# Patient Record
Sex: Male | Born: 1965 | Race: White | Hispanic: No | Marital: Married | Smoking: Former smoker
Health system: Southern US, Community
[De-identification: ages and names within clinical notes are randomized; demographics above are authoritative.]

## PROBLEM LIST (undated history)

## (undated) DIAGNOSIS — K5790 Diverticulosis of intestine, part unspecified, without perforation or abscess without bleeding: Secondary | ICD-10-CM

## (undated) DIAGNOSIS — K769 Liver disease, unspecified: Secondary | ICD-10-CM

## (undated) DIAGNOSIS — G4730 Sleep apnea, unspecified: Secondary | ICD-10-CM

## (undated) DIAGNOSIS — K754 Autoimmune hepatitis: Secondary | ICD-10-CM

## (undated) DIAGNOSIS — N200 Calculus of kidney: Secondary | ICD-10-CM

## (undated) DIAGNOSIS — I10 Essential (primary) hypertension: Secondary | ICD-10-CM

## (undated) DIAGNOSIS — K219 Gastro-esophageal reflux disease without esophagitis: Secondary | ICD-10-CM

## (undated) HISTORY — DX: Essential (primary) hypertension: I10

## (undated) HISTORY — DX: Sleep apnea, unspecified: G47.30

## (undated) HISTORY — DX: Calculus of kidney: N20.0

## (undated) HISTORY — DX: Liver disease, unspecified: K76.9

## (undated) HISTORY — DX: Gastro-esophageal reflux disease without esophagitis: K21.9

## (undated) HISTORY — DX: Autoimmune hepatitis: K75.4

## (undated) HISTORY — PX: LITHOTRIPSY: SUR834

## (undated) HISTORY — DX: Diverticulosis of intestine, part unspecified, without perforation or abscess without bleeding: K57.90

---

## 2015-08-25 HISTORY — PX: COLONOSCOPY: SHX174

## 2015-08-25 HISTORY — PX: ESOPHAGOGASTRODUODENOSCOPY: SHX1529

## 2015-11-13 LAB — HM COLONOSCOPY

## 2017-08-24 HISTORY — PX: ESOPHAGOGASTRODUODENOSCOPY: SHX1529

## 2018-08-11 DIAGNOSIS — K7460 Unspecified cirrhosis of liver: Secondary | ICD-10-CM | POA: Insufficient documentation

## 2020-02-07 ENCOUNTER — Encounter: Payer: Self-pay | Admitting: Family Medicine

## 2020-02-07 ENCOUNTER — Encounter: Payer: Self-pay | Admitting: Gastroenterology

## 2020-02-07 ENCOUNTER — Other Ambulatory Visit: Payer: Self-pay

## 2020-02-07 ENCOUNTER — Ambulatory Visit (INDEPENDENT_AMBULATORY_CARE_PROVIDER_SITE_OTHER): Payer: BC Managed Care – PPO | Admitting: Family Medicine

## 2020-02-07 VITALS — BP 157/88 | HR 75 | Temp 98.1°F | Ht 65.35 in | Wt 250.4 lb

## 2020-02-07 DIAGNOSIS — I10 Essential (primary) hypertension: Secondary | ICD-10-CM | POA: Diagnosis not present

## 2020-02-07 DIAGNOSIS — K754 Autoimmune hepatitis: Secondary | ICD-10-CM | POA: Diagnosis not present

## 2020-02-07 DIAGNOSIS — R0683 Snoring: Secondary | ICD-10-CM | POA: Diagnosis not present

## 2020-02-07 MED ORDER — ESTRADIOL 1 MG PO TABS: 1.0000 mg | ORAL_TABLET | Freq: Every day | ORAL | 1 refills | Status: DC

## 2020-02-07 NOTE — Assessment & Plan Note (Signed)
Morbid obesity associated with HTN.  Discussed working on weight loss and limiting late night snacking.

## 2020-02-07 NOTE — Assessment & Plan Note (Signed)
Stable with azathioprine.  Reports having labs just before moving to the area. Records requested Referral placed to GI.

## 2020-02-07 NOTE — Assessment & Plan Note (Signed)
Blood pressure is not at goal at for age and co-morbidities.  Reports that readings tend to be higher in doctors office.  I recommend continuation of coreg.  In addition they were instructed to follow a low sodium diet with regular exercise to help to maintain adequate control of blood pressure.

## 2020-02-07 NOTE — Assessment & Plan Note (Signed)
Home sleep study ordered

## 2020-02-07 NOTE — Patient Instructions (Signed)
Great to meet you today! I have entered orders for home sleep study and GI referral.  You will be contacted to set these up.  Please continue current medications for now.

## 2020-02-07 NOTE — Progress Notes (Signed)
Gregory Duarte - 54 y.o. male MRN [id number]  Date of birth: 1966/08/19  Subjective Chief Complaint  Patient presents with  . Establish Care    HPI Gregory Duarte is a 54 y.o. male with history of HTN and autoimmune hepatitis.  Recently moved here from [place] area.  He was followed by GI specialist in [place], reports liver disease has been stable.  He continues on azathioprine for this.  He does need new referral to GI specialist here as well.   For HTN he has been prescribed carvedilol 25mg  BID.  He is compliant with medication.  He denies symptoms related to HTN including chest pain, shortness of breath, palpitations, headache or vision changes.   He does report weight gain of about 20lbs over the past year.  Has had trouble finding motivation for weight loss.  He does try to stay pretty active but admits diet could be better.  He does often snack in the evenings.   He does feel like he sleeps well but has some morning fatigue and wife reports that he snores heavily.   ROS:  A comprehensive ROS was completed and negative except as noted per HPI   No Known Allergies  Past Medical History:  Diagnosis Date  . Autoimmune hepatitis ([place])   . Hypertension     History reviewed. No pertinent surgical history.  Social History   Socioeconomic History  . Marital status: Married    Spouse name: Tyreke Kaeser  . Number of children: Not on file  . Years of education: Not on file  . Highest education level: Not on file  Occupational History  . Not on file  Tobacco Use  . Smoking status: Former Smoker    Types: Cigarettes    Quit date: 1991    Years since quitting: 30.4  . Smokeless tobacco: Never Used  Vaping Use  . Vaping Use: Some days  . Substances: CBD  Substance and Sexual Activity  . Alcohol use: Not Currently  . Drug use: Yes    Types: Marijuana    Comment: sometimes  . Sexual activity: Yes    Partners: Female  Other Topics Concern  . Not on file  Social History  Narrative  . Not on file   Social Determinants of Health   Financial Resource Strain:   . Difficulty of Paying Living Expenses:   Food Insecurity:   . Worried About Charity fundraiser in the Last Year:   . Arboriculturist in the Last Year:   Transportation Needs:   . Film/video editor (Medical):   Marland Kitchen Lack of Transportation (Non-Medical):   Physical Activity:   . Days of Exercise per Week:   . Minutes of Exercise per Session:   Stress:   . Feeling of Stress :   Social Connections:   . Frequency of Communication with Friends and Family:   . Frequency of Social Gatherings with Friends and Family:   . Attends Religious Services:   . Active Member of Clubs or Organizations:   . Attends Archivist Meetings:   Marland Kitchen Marital Status:     Family History  Problem Relation Age of Onset  . Diabetes Mother   . Hypertension Father   . Diabetes Father     Health Maintenance  Topic Date Due  . Hepatitis C Screening  Never done  . HIV Screening  Never done  . TETANUS/TDAP  Never done  . COLONOSCOPY  Never done  . INFLUENZA VACCINE  03/24/2020  . COVID-19 Vaccine  Completed     ----------------------------------------------------------------------------------------------------------------------------------------------------------------------------------------------------------------- Physical Exam BP (!) 157/88 (BP Location: Left Arm, Patient Position: Sitting, Cuff Size: Large)   Pulse 75   Temp 98.1 F (36.7 C) (Oral)   Ht 5' 5.35" (1.66 m)   Wt 250 lb 6.4 oz (113.6 kg)   SpO2 96%   BMI 41.22 kg/m   Physical Exam Constitutional:      Appearance: Normal appearance.  HENT:     Head: Normocephalic and atraumatic.  Eyes:     General: No scleral icterus. Cardiovascular:     Rate and Rhythm: Normal rate and regular rhythm.  Pulmonary:     Effort: Pulmonary effort is normal.     Breath sounds: Normal breath sounds.  Abdominal:     General: Abdomen is flat.  There is no distension.     Palpations: Abdomen is soft.     Tenderness: There is no abdominal tenderness.  Musculoskeletal:     Cervical back: Neck supple.  Skin:    General: Skin is warm and dry.  Neurological:     General: No focal deficit present.     Mental Status: He is alert.  Psychiatric:        Mood and Affect: Mood normal.     ------------------------------------------------------------------------------------------------------------------------------------------------------------------------------------------------------------------- Assessment and Plan  Morbid obesity (HCC) Morbid obesity associated with HTN.  Discussed working on weight loss and limiting late night snacking.   Snoring Home sleep study ordered.   Autoimmune hepatitis (HCC) Stable with azathioprine.  Reports having labs just before moving to the area. Records requested Referral placed to GI.    Essential hypertension Blood pressure is not at goal at for age and co-morbidities.  Reports that readings tend to be higher in doctors office.  I recommend continuation of coreg.  In addition they were instructed to follow a low sodium diet with regular exercise to help to maintain adequate control of blood pressure.     Meds ordered this encounter  Medications  . DISCONTD: estradiol (ESTRACE) 1 MG tablet    Sig: Take 1 tablet (1 mg total) by mouth daily.    Dispense:  90 tablet    Refill:  1    Return in about 3 months (around 05/09/2020) for HTN.    This visit occurred during the SARS-CoV-2 public health emergency.  Safety protocols were in place, including screening questions prior to the visit, additional usage of staff PPE, and extensive cleaning of exam room while observing appropriate contact time as indicated for disinfecting solutions.

## 2020-04-05 ENCOUNTER — Other Ambulatory Visit: Payer: Self-pay

## 2020-04-05 ENCOUNTER — Encounter: Payer: Self-pay | Admitting: Gastroenterology

## 2020-04-05 ENCOUNTER — Ambulatory Visit (INDEPENDENT_AMBULATORY_CARE_PROVIDER_SITE_OTHER): Payer: BC Managed Care – PPO | Admitting: Gastroenterology

## 2020-04-05 VITALS — BP 160/94 | HR 89 | Ht 66.0 in | Wt 247.5 lb

## 2020-04-05 DIAGNOSIS — Z86010 Personal history of colonic polyps: Secondary | ICD-10-CM | POA: Diagnosis not present

## 2020-04-05 DIAGNOSIS — K7460 Unspecified cirrhosis of liver: Secondary | ICD-10-CM

## 2020-04-05 DIAGNOSIS — K754 Autoimmune hepatitis: Secondary | ICD-10-CM

## 2020-04-05 NOTE — Progress Notes (Signed)
Chief Complaint:   Referring Provider:  Everrett Coombe, DO      ASSESSMENT AND PLAN;   #1. AIH/NAFLD liver cirrhosis.  No portal hypertension  #2. H/O Polyps on colonoscopy 2017 @[place] .  Repeat in 5 years  #3. GERD. S/P EGD 2017.   Plan: -CBC, CMP, AFP, PT INR -Check HBsAb titer and HAV total Ab.  If neg, would recommend vaccination for A and B. -Continue same meds including omeprazole -2018 Abdo complete. -Start exercising and reduce weight.  Exercise like walking 30 min/day, at least 3 x week.  He can start walking with his wife. -Can have up to 2 cups of coffee per day. -RTC 12 weeks.  Aim is to reduce 6lb over 12 weeks. -Please obtain previous records from [place].  Care everywhere needs authorization.      HPI:    Gregory Duarte is a 54 y.o. male  With obesity, HTN, OSA (likely)  Dx AIH 97yrs ago, initially treated with prednisone and Imuran.  Has been on imuran eversince.  Has been tried to wean off.  Unfortunately when he went to 25 mg of Imuran (over 6 to 7 years ago), his liver's function tests started increasing.  It was decided to continue him on Imuran 50 mg p.o. once a day.  Both parents had autoimmune hepatitis.  Most recently he has been diagnosed as having liver cirrhosis due to autoimmune hepatitis vs NAFLD vs both.  No nausea, vomiting, heartburn (as long as he takes omeprazole once a day), regurgitation, odynophagia or dysphagia.  No significant diarrhea or constipation. No melena or hematochezia. No unintentional weight loss. No abdominal pain.  No H/O itching, skin lesions, easy bruisability, intake of OTC meds including diet pills, herbal medications, anabolic steroids or Tylenol. There is no H/O blood transfusions, IVDA. No jaundice, dark urine or pale stools. No alcohol abuse.  Here to get established.   Past Medical History:  Diagnosis Date   Autoimmune hepatitis (HCC)    Chronic liver disease    Diverticulosis    GERD (gastroesophageal  reflux disease)    Hypertension    Kidney stone    Sleep apnea     Past Surgical History:  Procedure Laterality Date   COLONOSCOPY  2017   [place] [address] [place]   ESOPHAGOGASTRODUODENOSCOPY  2017   [facility]   LITHOTRIPSY     over 10-15 years ago    Family History  Problem Relation Age of Onset   Diabetes Mother    Autoimmune disease Mother        Autoimmune hepatisits   Hypertension Father    Diabetes Father    Autoimmune disease Father        Autoimmune hepatitis   Colon cancer Neg Hx    Esophageal cancer Neg Hx     Social History   Tobacco Use   Smoking status: Former Smoker    Types: Cigarettes    Quit date: 1991    Years since quitting: 30.6   Smokeless tobacco: Never Used  Blaine Hamper Use: Some days   Substances: CBD  Substance Use Topics   Alcohol use: Not Currently   Drug use: Yes    Types: Marijuana    Comment: sometimes    Current Outpatient Medications  Medication Sig Dispense Refill   azaTHIOprine (IMURAN) 50 MG tablet Take 50 mg by mouth daily.     carvedilol (COREG) 12.5 MG tablet Take 25 mg by mouth 2 (two) times  daily with a meal.      OMEPRAZOLE PO Take 1 tablet by mouth daily.     No current facility-administered medications for this visit.    No Known Allergies  Review of Systems:  Constitutional: Denies fever, chills, diaphoresis, appetite change and fatigue.  HEENT: Denies photophobia, eye pain.  Has allergies. Respiratory: Denies SOB, DOE, cough, chest tightness,  and wheezing.   Cardiovascular: Denies chest pain, palpitations and leg swelling.  Genitourinary: Denies dysuria, urgency, frequency, hematuria, flank pain and difficulty urinating.  Has excessive urination. Musculoskeletal: Denies myalgias, back pain, joint swelling, arthralgias and gait problem.  Skin: No rash.  Neurological: Denies dizziness, seizures, syncope, weakness, light-headedness, numbness and headaches.    Hematological: Denies adenopathy. Easy bruising, personal or family bleeding history  Psychiatric/Behavioral: No anxiety or depression     Physical Exam:    BP (!) 160/94    Pulse 89    Ht 5\' 6"  (1.676 m)    Wt 247 lb 8 oz (112.3 kg)    BMI 39.95 kg/m  Wt Readings from Last 3 Encounters:  04/05/20 247 lb 8 oz (112.3 kg)  02/07/20 250 lb 6.4 oz (113.6 kg)   Constitutional:  Well-developed, in no acute distress. Psychiatric: Normal mood and affect. Behavior is normal. HEENT: Pupils normal.  Conjunctivae are normal. No scleral icterus. Neck supple.  Cardiovascular: Normal rate, regular rhythm. No edema Pulmonary/chest: Effort normal and breath sounds normal. No wheezing, rales or rhonchi. Abdominal: Soft, nondistended. Nontender. Bowel sounds active throughout. There are no masses palpable. No hepatomegaly. Rectal:  defered Neurological: Alert and oriented to person place and time. Skin: Skin is warm and dry. No rashes noted.  Data Reviewed: I have personally reviewed following labs and imaging studies    02/09/20, MD 04/05/2020, 2:21 PM  Cc: 04/07/2020, DO

## 2020-04-05 NOTE — Patient Instructions (Addendum)
If you are age 54 or older, your body mass index should be between 23-30. Your Body mass index is 39.95 kg/m. If this is out of the aforementioned range listed, please consider follow up with your Primary Care Provider.  If you are age 24 or younger, your body mass index should be between 19-25. Your Body mass index is 39.95 kg/m. If this is out of the aformentioned range listed, please consider follow up with your Primary Care Provider.    Your provider has requested that you go to the basement level for lab work  At [address] [place], [place], [place]  before leaving today. Press "B" on the elevator. The lab is located at the first door on the left as you exit the elevator.  You have been scheduled for an abdominal ultrasound at [facility] on 04/11/20 at 10 am. Please arrive 15 minutes prior to your appointment for registration. Make certain not to have anything to eat or drink after midnight the day prior to your appointment. Should you need to reschedule your appointment, please contact radiology at [phone]. This test typically takes about 30 minutes to perform.  Continue same medications.  Start exercising and reduce weight.  Walk 30 minutes a day 3 times weekly. Lose 6 pounds over the next 12 weeks.  Can have up to two cups of coffee per day.  Follow up in three months.  Please call the office for an appointment as the schedule is not available at this time.   Thank you,  Dr. Lynann Bologna

## 2020-04-11 ENCOUNTER — Ambulatory Visit (HOSPITAL_COMMUNITY)
Admission: RE | Admit: 2020-04-11 | Discharge: 2020-04-11 | Disposition: A | Discharge status: Home / Self Care | Payer: BC Managed Care – PPO | Source: Ambulatory Visit | Attending: Gastroenterology | Admitting: Gastroenterology

## 2020-04-11 ENCOUNTER — Other Ambulatory Visit: Payer: Self-pay

## 2020-04-11 DIAGNOSIS — K754 Autoimmune hepatitis: Secondary | ICD-10-CM | POA: Diagnosis not present

## 2020-04-11 DIAGNOSIS — K7460 Unspecified cirrhosis of liver: Secondary | ICD-10-CM | POA: Insufficient documentation

## 2020-04-11 DIAGNOSIS — K760 Fatty (change of) liver, not elsewhere classified: Secondary | ICD-10-CM | POA: Diagnosis not present

## 2020-05-09 ENCOUNTER — Encounter: Payer: Self-pay | Admitting: Family Medicine

## 2020-05-09 ENCOUNTER — Ambulatory Visit (INDEPENDENT_AMBULATORY_CARE_PROVIDER_SITE_OTHER): Payer: BC Managed Care – PPO | Admitting: Family Medicine

## 2020-05-09 ENCOUNTER — Other Ambulatory Visit: Payer: Self-pay

## 2020-05-09 VITALS — BP 163/89 | HR 74 | Ht 66.0 in | Wt 248.0 lb

## 2020-05-09 DIAGNOSIS — N529 Male erectile dysfunction, unspecified: Secondary | ICD-10-CM

## 2020-05-09 DIAGNOSIS — E785 Hyperlipidemia, unspecified: Secondary | ICD-10-CM | POA: Insufficient documentation

## 2020-05-09 DIAGNOSIS — Z23 Encounter for immunization: Secondary | ICD-10-CM

## 2020-05-09 DIAGNOSIS — I10 Essential (primary) hypertension: Secondary | ICD-10-CM | POA: Diagnosis not present

## 2020-05-09 MED ORDER — AMLODIPINE BESYLATE 5 MG PO TABS: 5.0000 mg | ORAL_TABLET | Freq: Every day | ORAL | 3 refills | Status: DC

## 2020-05-09 MED ORDER — SILDENAFIL CITRATE 100 MG PO TABS: 50.0000 mg | ORAL_TABLET | Freq: Every day | ORAL | 11 refills | Status: DC | PRN

## 2020-05-09 NOTE — Progress Notes (Signed)
Gregory Duarte - 54 y.o. male MRN [id number]  Date of birth: 08/14/1966  Subjective Chief Complaint  Patient presents with  . Hypertension    HPI Gregory Duarte is a 54 y.o. male here today for follow up of HTN.  He is currently taking carvedilol however BP remains elevated.  He does not monitor BP at home.  He denies symptoms related to HTN including chest pain, shortness of breath, palpitations, headache or vision change.  He is also requesting renewal of sildenafil as well.  He used this previously with good results.  Denies side effects related to this medication.      ROS:  A comprehensive ROS was completed and negative except as noted per HPI  No Known Allergies  Past Medical History:  Diagnosis Date  . Autoimmune hepatitis (HCC)   . Chronic liver disease   . Diverticulosis   . GERD (gastroesophageal reflux disease)   . Hypertension   . Kidney stone   . Sleep apnea     Past Surgical History:  Procedure Laterality Date  . COLONOSCOPY  2017   [place], [place]. Dr Blaine Hamper  . ESOPHAGOGASTRODUODENOSCOPY  2019   [facility] Blaine Hamper  . LITHOTRIPSY     over 10-15 years ago    Social History   Socioeconomic History  . Marital status: Married    Spouse name: Paulette Lynch  . Number of children: Not on file  . Years of education: Not on file  . Highest education level: Not on file  Occupational History  . Not on file  Tobacco Use  . Smoking status: Former Smoker    Types: Cigarettes    Quit date: 1991    Years since quitting: 30.7  . Smokeless tobacco: Never Used  Vaping Use  . Vaping Use: Some days  . Substances: CBD  Substance and Sexual Activity  . Alcohol use: Not Currently  . Drug use: Yes    Types: Marijuana    Comment: sometimes  . Sexual activity: Yes    Partners: Female  Other Topics Concern  . Not on file  Social History Narrative  . Not on file   Social Determinants of Health   Financial Resource Strain:   . Difficulty of Paying Living  Expenses: Not on file  Food Insecurity:   . Worried About Programme researcher, broadcasting/film/video in the Last Year: Not on file  . Ran Out of Food in the Last Year: Not on file  Transportation Needs:   . Lack of Transportation (Medical): Not on file  . Lack of Transportation (Non-Medical): Not on file  Physical Activity:   . Days of Exercise per Week: Not on file  . Minutes of Exercise per Session: Not on file  Stress:   . Feeling of Stress : Not on file  Social Connections:   . Frequency of Communication with Friends and Family: Not on file  . Frequency of Social Gatherings with Friends and Family: Not on file  . Attends Religious Services: Not on file  . Active Member of Clubs or Organizations: Not on file  . Attends Banker Meetings: Not on file  . Marital Status: Not on file    Family History  Problem Relation Age of Onset  . Diabetes Mother   . Autoimmune disease Mother        Autoimmune hepatisits  . Hypertension Father   . Diabetes Father   . Autoimmune disease Father        Autoimmune hepatitis  .  Colon cancer Neg Hx   . Esophageal cancer Neg Hx     Health Maintenance  Topic Date Due  . Hepatitis C Screening  Never done  . HIV Screening  Never done  . TETANUS/TDAP  Never done  . COLONOSCOPY  Never done  . INFLUENZA VACCINE  Never done  . COVID-19 Vaccine  Completed     ----------------------------------------------------------------------------------------------------------------------------------------------------------------------------------------------------------------- Physical Exam BP (!) 163/89   Pulse 74   Ht 5\' 6"  (1.676 m)   Wt 248 lb (112.5 kg)   SpO2 98%   BMI 40.03 kg/m   Physical Exam Constitutional:      Appearance: Normal appearance.  HENT:     Head: Normocephalic and atraumatic.  Eyes:     General: No scleral icterus. Cardiovascular:     Rate and Rhythm: Normal rate and regular rhythm.  Pulmonary:     Effort: Pulmonary effort is  normal.     Breath sounds: Normal breath sounds.  Neurological:     General: No focal deficit present.     Mental Status: He is alert.  Psychiatric:        Mood and Affect: Mood normal.        Behavior: Behavior normal.     ------------------------------------------------------------------------------------------------------------------------------------------------------------------------------------------------------------------- Assessment and Plan  Essential hypertension Blood pressure is not at goal at for age and co-morbidities.  I recommend addition of amlodipine 5mg  to current rx of carvedilol.  In addition they were instructed to follow a low sodium diet with regular exercise to help to maintain adequate control of blood pressure.    Erectile dysfunction He has had success and tolerated sildenafil well previously.  Will re-prescribe.  Side effects reviewed.    Meds ordered this encounter  Medications  . amLODipine (NORVASC) 5 MG tablet    Sig: Take 1 tablet (5 mg total) by mouth daily.    Dispense:  90 tablet    Refill:  3  . sildenafil (VIAGRA) 100 MG tablet    Sig: Take 0.5-1 tablets (50-100 mg total) by mouth daily as needed for erectile dysfunction.    Dispense:  15 tablet    Refill:  11    Return in about 6 weeks (around 06/20/2020) for BP .    This visit occurred during the SARS-CoV-2 public health emergency.  Safety protocols were in place, including screening questions prior to the visit, additional usage of staff PPE, and extensive cleaning of exam room while observing appropriate contact time as indicated for disinfecting solutions.

## 2020-05-09 NOTE — Assessment & Plan Note (Signed)
He has had success and tolerated sildenafil well previously.  Will re-prescribe.  Side effects reviewed.

## 2020-05-09 NOTE — Assessment & Plan Note (Signed)
Blood pressure is not at goal at for age and co-morbidities.  I recommend addition of amlodipine 5mg  to current rx of carvedilol.  In addition they were instructed to follow a low sodium diet with regular exercise to help to maintain adequate control of blood pressure.

## 2020-05-09 NOTE — Patient Instructions (Signed)
Start amlodipine daily.  Follow a reduced sodium diet.  Follow up in 6-8 weeks for BP recheck.

## 2020-06-20 ENCOUNTER — Encounter: Payer: Self-pay | Admitting: Family Medicine

## 2020-06-20 ENCOUNTER — Ambulatory Visit (INDEPENDENT_AMBULATORY_CARE_PROVIDER_SITE_OTHER): Payer: BC Managed Care – PPO | Admitting: Family Medicine

## 2020-06-20 ENCOUNTER — Other Ambulatory Visit: Payer: Self-pay

## 2020-06-20 DIAGNOSIS — I10 Essential (primary) hypertension: Secondary | ICD-10-CM | POA: Diagnosis not present

## 2020-06-20 DIAGNOSIS — B001 Herpesviral vesicular dermatitis: Secondary | ICD-10-CM

## 2020-06-20 MED ORDER — CARVEDILOL 12.5 MG PO TABS: 25.0000 mg | ORAL_TABLET | Freq: Two times a day (BID) | ORAL | 2 refills | Status: DC

## 2020-06-20 MED ORDER — ACYCLOVIR 5 % EX OINT
TOPICAL_OINTMENT | CUTANEOUS | 1 refills | Status: AC
Start: 2020-06-20 — End: ?

## 2020-06-20 MED ORDER — AMLODIPINE BESYLATE 10 MG PO TABS: 10.0000 mg | ORAL_TABLET | Freq: Every day | ORAL | 2 refills | Status: DC

## 2020-06-20 NOTE — Patient Instructions (Signed)
Increase amlodipine to 10mg .  New prescription has been sent in.  Check BP readings at home.  See me again in about 2 months   DASH Eating Plan DASH stands for "Dietary Approaches to Stop Hypertension." The DASH eating plan is a healthy eating plan that has been shown to reduce high blood pressure (hypertension). It may also reduce your risk for type 2 diabetes, heart disease, and stroke. The DASH eating plan may also help with weight loss. What are tips for following this plan?  General guidelines  Avoid eating more than 2,300 mg (milligrams) of salt (sodium) a day. If you have hypertension, you may need to reduce your sodium intake to 1,500 mg a day.  Limit alcohol intake to no more than 1 drink a day for nonpregnant women and 2 drinks a day for men. One drink equals 12 oz of beer, 5 oz of wine, or 1 oz of hard liquor.  Work with your health care provider to maintain a healthy body weight or to lose weight. Ask what an ideal weight is for you.  Get at least 30 minutes of exercise that causes your heart to beat faster (aerobic exercise) most days of the week. Activities may include walking, swimming, or biking.  Work with your health care provider or diet and nutrition specialist (dietitian) to adjust your eating plan to your individual calorie needs. Reading food labels   Check food labels for the amount of sodium per serving. Choose foods with less than 5 percent of the Daily Value of sodium. Generally, foods with less than 300 mg of sodium per serving fit into this eating plan.  To find whole grains, look for the word "whole" as the first word in the ingredient list. Shopping  Buy products labeled as "low-sodium" or "no salt added."  Buy fresh foods. Avoid canned foods and premade or frozen meals. Cooking  Avoid adding salt when cooking. Use salt-free seasonings or herbs instead of table salt or sea salt. Check with your health care provider or pharmacist before using salt  substitutes.  Do not fry foods. Cook foods using healthy methods such as baking, boiling, grilling, and broiling instead.  Cook with heart-healthy oils, such as olive, canola, soybean, or sunflower oil. Meal planning  Eat a balanced diet that includes: ? 5 or more servings of fruits and vegetables each day. At each meal, try to fill half of your plate with fruits and vegetables. ? Up to 6-8 servings of whole grains each day. ? Less than 6 oz of lean meat, poultry, or fish each day. A 3-oz serving of meat is about the same size as a deck of cards. One egg equals 1 oz. ? 2 servings of low-fat dairy each day. ? A serving of nuts, seeds, or beans 5 times each week. ? Heart-healthy fats. Healthy fats called Omega-3 fatty acids are found in foods such as flaxseeds and coldwater fish, like sardines, salmon, and mackerel.  Limit how much you eat of the following: ? Canned or prepackaged foods. ? Food that is high in trans fat, such as fried foods. ? Food that is high in saturated fat, such as fatty meat. ? Sweets, desserts, sugary drinks, and other foods with added sugar. ? Full-fat dairy products.  Do not salt foods before eating.  Try to eat at least 2 vegetarian meals each week.  Eat more home-cooked food and less restaurant, buffet, and fast food.  When eating at a restaurant, ask that your food be  prepared with less salt or no salt, if possible. What foods are recommended? The items listed may not be a complete list. Talk with your dietitian about what dietary choices are best for you. Grains Whole-grain or whole-wheat bread. Whole-grain or whole-wheat pasta. Bitton rice. Modena Morrow. Bulgur. Whole-grain and low-sodium cereals. Pita bread. Low-fat, low-sodium crackers. Whole-wheat flour tortillas. Vegetables Fresh or frozen vegetables (raw, steamed, roasted, or grilled). Low-sodium or reduced-sodium tomato and vegetable juice. Low-sodium or reduced-sodium tomato sauce and tomato  paste. Low-sodium or reduced-sodium canned vegetables. Fruits All fresh, dried, or frozen fruit. Canned fruit in natural juice (without added sugar). Meat and other protein foods Skinless chicken or [place]. Ground chicken or [place]. Pork with fat trimmed off. Fish and seafood. Egg whites. Dried beans, peas, or lentils. Unsalted nuts, nut butters, and seeds. Unsalted canned beans. Lean cuts of beef with fat trimmed off. Low-sodium, lean deli meat. Dairy Low-fat (1%) or fat-free (skim) milk. Fat-free, low-fat, or reduced-fat cheeses. Nonfat, low-sodium ricotta or cottage cheese. Low-fat or nonfat yogurt. Low-fat, low-sodium cheese. Fats and oils Soft margarine without trans fats. Vegetable oil. Low-fat, reduced-fat, or light mayonnaise and salad dressings (reduced-sodium). Canola, safflower, olive, soybean, and sunflower oils. Avocado. Seasoning and other foods Herbs. Spices. Seasoning mixes without salt. Unsalted popcorn and pretzels. Fat-free sweets. What foods are not recommended? The items listed may not be a complete list. Talk with your dietitian about what dietary choices are best for you. Grains Baked goods made with fat, such as croissants, muffins, or some breads. Dry pasta or rice meal packs. Vegetables Creamed or fried vegetables. Vegetables in a cheese sauce. Regular canned vegetables (not low-sodium or reduced-sodium). Regular canned tomato sauce and paste (not low-sodium or reduced-sodium). Regular tomato and vegetable juice (not low-sodium or reduced-sodium). Angie Fava. Olives. Fruits Canned fruit in a light or heavy syrup. Fried fruit. Fruit in cream or butter sauce. Meat and other protein foods Fatty cuts of meat. Ribs. Fried meat. Berniece Salines. Sausage. Bologna and other processed lunch meats. Salami. Fatback. Hotdogs. Bratwurst. Salted nuts and seeds. Canned beans with added salt. Canned or smoked fish. Whole eggs or egg yolks. Chicken or [place] with skin. Dairy Whole or 2% milk,  cream, and half-and-half. Whole or full-fat cream cheese. Whole-fat or sweetened yogurt. Full-fat cheese. Nondairy creamers. Whipped toppings. Processed cheese and cheese spreads. Fats and oils Butter. Stick margarine. Lard. Shortening. Ghee. Bacon fat. Tropical oils, such as coconut, palm kernel, or palm oil. Seasoning and other foods Salted popcorn and pretzels. Onion salt, garlic salt, seasoned salt, table salt, and sea salt. Worcestershire sauce. Tartar sauce. Barbecue sauce. Teriyaki sauce. Soy sauce, including reduced-sodium. Steak sauce. Canned and packaged gravies. Fish sauce. Oyster sauce. Cocktail sauce. Horseradish that you find on the shelf. Ketchup. Mustard. Meat flavorings and tenderizers. Bouillon cubes. Hot sauce and Tabasco sauce. Premade or packaged marinades. Premade or packaged taco seasonings. Relishes. Regular salad dressings. Where to find more information:  National Heart, Lung, and [place]: https://wilson-eaton.com/  American Heart Association: www.heart.org Summary  The DASH eating plan is a healthy eating plan that has been shown to reduce high blood pressure (hypertension). It may also reduce your risk for type 2 diabetes, heart disease, and stroke.  With the DASH eating plan, you should limit salt (sodium) intake to 2,300 mg a day. If you have hypertension, you may need to reduce your sodium intake to 1,500 mg a day.  When on the DASH eating plan, aim to eat more fresh fruits and vegetables, whole grains,  lean proteins, low-fat dairy, and heart-healthy fats.  Work with your health care provider or diet and nutrition specialist (dietitian) to adjust your eating plan to your individual calorie needs. This information is not intended to replace advice given to you by your health care provider. Make sure you discuss any questions you have with your health care provider. Document Revised: 07/23/2017 Document Reviewed: 08/03/2016 Elsevier Patient Education  2020 [employer].

## 2020-06-20 NOTE — Assessment & Plan Note (Signed)
Using zovirax as needed, working well for him.  Will plan to continue, rx renewed.

## 2020-06-20 NOTE — Assessment & Plan Note (Signed)
BP remains elevated.  Increase amlodpine to 10mg  daily. Continue coreg at current strength.   Recommend home monitoring as well.  Given handout on DASH diet.  Follow up in 2 months.

## 2020-06-20 NOTE — Progress Notes (Signed)
Gregory Duarte - 54 y.o. male MRN [id number]  Date of birth: Jun 01, 1966  Subjective Chief Complaint  Patient presents with  . Hypertension  . Mouth Lesions    HPI Gregory Duarte is a 54 y.o. male here today for follow up of HTN.  He also needs refill of zovirax ointment for cold sores.  He uses this as needed.   BP current treatment with amlodipine 5mg  and carvedilol 12.5mg  daily.  He is tolerating medications well without side effects.  He is starting to work on cutting back on sodium intake.  He does not monitor BP at home.  He has not had symptoms related to HTN including chest pain, shortness of breath, palpitations, headache or vision changes.   ROS:  A comprehensive ROS was completed and negative except as noted per HPI  No Known Allergies  Past Medical History:  Diagnosis Date  . Autoimmune hepatitis (HCC)   . Chronic liver disease   . Diverticulosis   . GERD (gastroesophageal reflux disease)   . Hypertension   . Kidney stone   . Sleep apnea     Past Surgical History:  Procedure Laterality Date  . COLONOSCOPY  2017   [place] [place]  . ESOPHAGOGASTRODUODENOSCOPY  2019   [facility]  . LITHOTRIPSY     over 10-15 years ago    Social History   Socioeconomic History  . Marital status: Married    Spouse name: Tyge Somers  . Number of children: Not on file  . Years of education: Not on file  . Highest education level: Not on file  Occupational History  . Not on file  Tobacco Use  . Smoking status: Former Smoker    Types: Cigarettes    Quit date: 1991    Years since quitting: 30.8  . Smokeless tobacco: Never Used  Vaping Use  . Vaping Use: Some days  . Substances: CBD  Substance and Sexual Activity  . Alcohol use: Not Currently  . Drug use: Yes    Types: Marijuana    Comment: sometimes  . Sexual activity: Yes    Partners: Female  Other Topics Concern  . Not on file  Social History Narrative  . Not on file   Social  Determinants of Health   Financial Resource Strain:   . Difficulty of Paying Living Expenses: Not on file  Food Insecurity:   . Worried About Danie Binder in the Last Year: Not on file  . Ran Out of Food in the Last Year: Not on file  Transportation Needs:   . Lack of Transportation (Medical): Not on file  . Lack of Transportation (Non-Medical): Not on file  Physical Activity:   . Days of Exercise per Week: Not on file  . Minutes of Exercise per Session: Not on file  Stress:   . Feeling of Stress : Not on file  Social Connections:   . Frequency of Communication with Friends and Family: Not on file  . Frequency of Social Gatherings with Friends and Family: Not on file  . Attends Religious Services: Not on file  . Active Member of Clubs or Organizations: Not on file  . Attends Programme researcher, broadcasting/film/video Meetings: Not on file  . Marital Status: Not on file    Family History  Problem Relation Age of Onset  . Diabetes Mother   . Autoimmune disease Mother        Autoimmune hepatisits  . Hypertension Father   . Diabetes  Father   . Autoimmune disease Father        Autoimmune hepatitis  . Colon cancer Neg Hx   . Esophageal cancer Neg Hx     Health Maintenance  Topic Date Due  . Hepatitis C Screening  Never done  . HIV Screening  Never done  . COLONOSCOPY  Never done  . TETANUS/TDAP  05/09/2030  . INFLUENZA VACCINE  Completed  . COVID-19 Vaccine  Completed     ----------------------------------------------------------------------------------------------------------------------------------------------------------------------------------------------------------------- Physical Exam BP (!) 161/83 (BP Location: Left Arm, Patient Position: Sitting, Cuff Size: Large)   Pulse 77   Temp 98 F (36.7 C)   Wt 248 lb 12.8 oz (112.9 kg)   SpO2 98%   BMI 40.16 kg/m   Physical Exam Constitutional:      Appearance: Normal appearance.  Cardiovascular:     Rate and Rhythm: Normal  rate and regular rhythm.  Pulmonary:     Effort: Pulmonary effort is normal.     Breath sounds: Normal breath sounds.  Skin:    General: Skin is warm and dry.  Neurological:     General: No focal deficit present.     Mental Status: He is alert.  Psychiatric:        Mood and Affect: Mood normal.     ------------------------------------------------------------------------------------------------------------------------------------------------------------------------------------------------------------------- Assessment and Plan  Recurrent cold sores Using zovirax as needed, working well for him.  Will plan to continue, rx renewed.   Essential hypertension BP remains elevated.  Increase amlodpine to 10mg  daily. Continue coreg at current strength.   Recommend home monitoring as well.  Given handout on DASH diet.  Follow up in 2 months.    Meds ordered this encounter  Medications  . amLODipine (NORVASC) 10 MG tablet    Sig: Take 1 tablet (10 mg total) by mouth daily.    Dispense:  90 tablet    Refill:  2  . acyclovir ointment (ZOVIRAX) 5 %    Sig: Apply every 3 hours while awake.  Apply up to 4 days as needed.    Dispense:  30 g    Refill:  1  . carvedilol (COREG) 12.5 MG tablet    Sig: Take 2 tablets (25 mg total) by mouth 2 (two) times daily with a meal.    Dispense:  90 tablet    Refill:  2    Return in about 2 months (around 08/20/2020) for HTN.    This visit occurred during the SARS-CoV-2 public health emergency.  Safety protocols were in place, including screening questions prior to the visit, additional usage of staff PPE, and extensive cleaning of exam room while observing appropriate contact time as indicated for disinfecting solutions.

## 2020-07-11 ENCOUNTER — Encounter: Payer: Self-pay | Admitting: Family Medicine

## 2020-08-22 ENCOUNTER — Encounter: Payer: Self-pay | Admitting: Family Medicine

## 2020-09-03 ENCOUNTER — Ambulatory Visit: Payer: BC Managed Care – PPO | Admitting: Family Medicine

## 2020-10-02 ENCOUNTER — Other Ambulatory Visit: Payer: Self-pay | Admitting: Family Medicine

## 2021-03-02 ENCOUNTER — Other Ambulatory Visit: Payer: Self-pay | Admitting: Family Medicine

## 2021-03-02 DIAGNOSIS — I10 Essential (primary) hypertension: Secondary | ICD-10-CM

## 2021-03-05 NOTE — Telephone Encounter (Signed)
Please call pt and schedule appt for refills. Sent 30 days to hold.

## 2021-03-06 NOTE — Telephone Encounter (Signed)
Left voicemail for patient to call back to get this appointment scheduled. AM  

## 2021-03-28 ENCOUNTER — Other Ambulatory Visit: Payer: Self-pay | Admitting: Family Medicine

## 2021-03-28 DIAGNOSIS — I10 Essential (primary) hypertension: Secondary | ICD-10-CM

## 2021-03-31 ENCOUNTER — Other Ambulatory Visit: Payer: Self-pay | Admitting: Gastroenterology

## 2021-03-31 DIAGNOSIS — K7460 Unspecified cirrhosis of liver: Secondary | ICD-10-CM

## 2021-03-31 DIAGNOSIS — K754 Autoimmune hepatitis: Secondary | ICD-10-CM

## 2021-04-01 NOTE — Telephone Encounter (Signed)
Please call and schedule patient for follow-up appt with Dr. Ashley Royalty for hypertension.  Sending 15 day refill.  Thanks

## 2021-04-02 NOTE — Telephone Encounter (Signed)
LVM informing patient of no more refills until he follows up and that 15 day is the last refill

## 2021-04-13 ENCOUNTER — Other Ambulatory Visit: Payer: Self-pay | Admitting: Family Medicine

## 2021-04-13 DIAGNOSIS — I10 Essential (primary) hypertension: Secondary | ICD-10-CM

## 2021-04-19 ENCOUNTER — Other Ambulatory Visit: Payer: Self-pay | Admitting: Family Medicine

## 2021-04-19 DIAGNOSIS — I10 Essential (primary) hypertension: Secondary | ICD-10-CM

## 2021-04-22 ENCOUNTER — Encounter: Payer: Self-pay | Admitting: Family Medicine

## 2021-04-22 DIAGNOSIS — I10 Essential (primary) hypertension: Secondary | ICD-10-CM

## 2021-04-22 MED ORDER — CARVEDILOL 12.5 MG PO TABS
ORAL_TABLET | ORAL | 0 refills | Status: DC
Start: 2021-04-22 — End: 2021-05-06

## 2021-05-06 ENCOUNTER — Ambulatory Visit (INDEPENDENT_AMBULATORY_CARE_PROVIDER_SITE_OTHER): Payer: BC Managed Care – PPO | Admitting: Family Medicine

## 2021-05-06 ENCOUNTER — Encounter: Payer: Self-pay | Admitting: Family Medicine

## 2021-05-06 VITALS — BP 160/82 | HR 70 | Temp 97.9°F | Ht 66.0 in | Wt 239.0 lb

## 2021-05-06 DIAGNOSIS — Z125 Encounter for screening for malignant neoplasm of prostate: Secondary | ICD-10-CM

## 2021-05-06 DIAGNOSIS — K754 Autoimmune hepatitis: Secondary | ICD-10-CM

## 2021-05-06 DIAGNOSIS — I10 Essential (primary) hypertension: Secondary | ICD-10-CM | POA: Diagnosis not present

## 2021-05-06 DIAGNOSIS — Z114 Encounter for screening for human immunodeficiency virus [HIV]: Secondary | ICD-10-CM

## 2021-05-06 DIAGNOSIS — Z23 Encounter for immunization: Secondary | ICD-10-CM

## 2021-05-06 DIAGNOSIS — Z13220 Encounter for screening for lipoid disorders: Secondary | ICD-10-CM

## 2021-05-06 DIAGNOSIS — Z0000 Encounter for general adult medical examination without abnormal findings: Secondary | ICD-10-CM | POA: Diagnosis not present

## 2021-05-06 DIAGNOSIS — Z1159 Encounter for screening for other viral diseases: Secondary | ICD-10-CM | POA: Diagnosis not present

## 2021-05-06 DIAGNOSIS — B353 Tinea pedis: Secondary | ICD-10-CM

## 2021-05-06 MED ORDER — AMLODIPINE BESYLATE 10 MG PO TABS: 10.0000 mg | ORAL_TABLET | Freq: Every day | ORAL | 2 refills | Status: DC

## 2021-05-06 MED ORDER — CICLOPIROX OLAMINE 0.77 % EX CREA: TOPICAL_CREAM | Freq: Two times a day (BID) | CUTANEOUS | 1 refills | Status: DC

## 2021-05-06 MED ORDER — CARVEDILOL 12.5 MG PO TABS: ORAL_TABLET | ORAL | 1 refills | Status: DC

## 2021-05-06 NOTE — Assessment & Plan Note (Signed)
Well adult Orders Placed This Encounter  Procedures  . Flu Vaccine QUAD 6+ mos PF IM (Fluarix Quad PF)  . Varicella-zoster vaccine IM (Shingrix)  . Hepatitis C Antibody  . HIV antibody (with reflex)  . COMPLETE METABOLIC PANEL WITH GFR  . CBC with Differential  . Lipid Panel w/reflex Direct LDL  . PSA  Screening: HIV, hepatitis C and PSA. Immunizations: Shingrix No. 2 and flu vaccine given today. Anticipatory guidance/risk factor reduction: Recommendations per AVS.

## 2021-05-06 NOTE — Assessment & Plan Note (Signed)
Recurrent issue for him.  We will try ciclopirox.  Discussed that if not improving we may need to try oral antifungals

## 2021-05-06 NOTE — Progress Notes (Signed)
Gregory Duarte - 55 y.o. male MRN [id number]  Date of birth: 03/15/1966  Subjective Chief Complaint  Patient presents with   Annual Exam    HPI Gregory Duarte is a 55 year old male here today for annual exam.  He has prior history of hypertension and autoimmune hepatitis.  He has been out of his antihypertensive medications.  Blood pressure is elevated today.  He is having some issues with recurrent foot fungus.  He has not been very good with his diet and exercise.  He does plan to make changes to help with this.  He is a non-smoker.  He denies alcohol use.  He would like to have second shingles vaccine.  He is due for flu vaccine as well.  Review of Systems  Constitutional:  Negative for chills, fever, malaise/fatigue and weight loss.  HENT:  Negative for congestion, ear pain and sore throat.   Eyes:  Negative for blurred vision, double vision and pain.  Respiratory:  Negative for cough and shortness of breath.   Cardiovascular:  Negative for chest pain and palpitations.  Gastrointestinal:  Negative for abdominal pain, blood in stool, constipation, heartburn and nausea.  Genitourinary:  Negative for dysuria and urgency.  Musculoskeletal:  Negative for joint pain and myalgias.  Neurological:  Negative for dizziness and headaches.  Endo/Heme/Allergies:  Does not bruise/bleed easily.  Psychiatric/Behavioral:  Negative for depression. The patient is not nervous/anxious and does not have insomnia.    No Known Allergies  Past Medical History:  Diagnosis Date   Autoimmune hepatitis (HCC)    Chronic liver disease    Diverticulosis    GERD (gastroesophageal reflux disease)    Hypertension    Kidney stone    Sleep apnea     Past Surgical History:  Procedure Laterality Date   COLONOSCOPY  2017   [place], [place]. Dr Gregory Duarte   ESOPHAGOGASTRODUODENOSCOPY  [address] Gregory Duarte   LITHOTRIPSY     over 10-15 years ago    Social History   Socioeconomic History   Marital  status: Married    Spouse name: Gregory Duarte   Number of children: Not on file   Years of education: Not on file   Highest education level: Not on file  Occupational History   Not on file  Tobacco Use   Smoking status: Former    Types: Cigarettes    Quit date: 56    Years since quitting: 31.7   Smokeless tobacco: Never  Vaping Use   Vaping Use: Some days   Substances: CBD  Substance and Sexual Activity   Alcohol use: Not Currently   Drug use: Yes    Types: Marijuana    Comment: sometimes   Sexual activity: Yes    Partners: Female  Other Topics Concern   Not on file  Social History Narrative   Not on file   Social Determinants of Health   Financial Resource Strain: Not on file  Food Insecurity: Not on file  Transportation Needs: Not on file  Physical Activity: Not on file  Stress: Not on file  Social Connections: Not on file    Family History  Problem Relation Age of Onset   Diabetes Mother    Autoimmune disease Mother        Autoimmune hepatisits   Hypertension Father    Diabetes Father    Autoimmune disease Father        Autoimmune hepatitis   Colon cancer Neg Hx    Esophageal cancer Neg Hx  Health Maintenance  Topic Date Due   Hepatitis C Screening  Never done   Zoster Vaccines- Shingrix (2 of 2) 07/01/2021   COLONOSCOPY (Pts 45-59yrs Insurance coverage will need to be confirmed)  11/12/2025   TETANUS/TDAP  05/09/2030   INFLUENZA VACCINE  Completed   COVID-19 Vaccine  Completed   HIV Screening  Completed   Pneumococcal Vaccine 64-44 Years old  Aged Out   HPV VACCINES  Aged Out     ----------------------------------------------------------------------------------------------------------------------------------------------------------------------------------------------------------------- Physical Exam BP (!) 160/82 (BP Location: Left Arm, Patient Position: Sitting, Cuff Size: Normal)   Pulse 70   Temp 97.9 F (36.6 C)   Ht 5\' 6"  (1.676 m)    Wt 239 lb (108.4 kg)   SpO2 97%   BMI 38.58 kg/m   Physical Exam Constitutional:      General: He is not in acute distress. HENT:     Head: Normocephalic and atraumatic.     Right Ear: Tympanic membrane and external ear normal.     Left Ear: Tympanic membrane and external ear normal.  Eyes:     General: No scleral icterus. Neck:     Thyroid: No thyromegaly.  Cardiovascular:     Rate and Rhythm: Normal rate and regular rhythm.     Heart sounds: Normal heart sounds.  Pulmonary:     Effort: Pulmonary effort is normal.     Breath sounds: Normal breath sounds.  Abdominal:     General: Bowel sounds are normal. There is no distension.     Palpations: Abdomen is soft.     Tenderness: There is no abdominal tenderness. There is no guarding.  Musculoskeletal:     Cervical back: Normal range of motion.  Lymphadenopathy:     Cervical: No cervical adenopathy.  Skin:    General: Skin is warm and dry.     Findings: No rash.  Neurological:     Mental Status: He is alert and oriented to person, place, and time.     Cranial Nerves: No cranial nerve deficit.     Motor: No abnormal muscle tone.  Psychiatric:        Mood and Affect: Mood normal.        Behavior: Behavior normal.    ------------------------------------------------------------------------------------------------------------------------------------------------------------------------------------------------------------------- Assessment and Plan  Tinea pedis Recurrent issue for him.  We will try ciclopirox.  Discussed that if not improving we may need to try oral antifungals  Essential hypertension Blood pressure elevated.  He has been out of medications.  We will have him restart these and follow-up in a few weeks for blood pressure recheck.  Low-sodium diet and weight loss encouraged.  Well adult exam Well adult Orders Placed This Encounter  Procedures   Flu Vaccine QUAD 6+ mos PF IM (Fluarix Quad PF)    Varicella-zoster vaccine IM (Shingrix)   Hepatitis C Antibody   HIV antibody (with reflex)   COMPLETE METABOLIC PANEL WITH GFR   CBC with Differential   Lipid Panel w/reflex Direct LDL   PSA  Screening: HIV, hepatitis C and PSA. Immunizations: Shingrix No. 2 and flu vaccine given today. Anticipatory guidance/risk factor reduction: Recommendations per AVS.   Meds ordered this encounter  Medications   amLODipine (NORVASC) 10 MG tablet    Sig: Take 1 tablet (10 mg total) by mouth daily.    Dispense:  90 tablet    Refill:  2   carvedilol (COREG) 12.5 MG tablet    Sig: TAKE 2 TABLETS BY MOUTH TWICE A DAY WITH A MEAL  Dispense:  180 tablet    Refill:  1   ciclopirox (LOPROX) 0.77 % cream    Sig: Apply topically 2 (two) times daily.    Dispense:  30 g    Refill:  1    Return in about 6 months (around 11/03/2021) for HTN.    This visit occurred during the SARS-CoV-2 public health emergency.  Safety protocols were in place, including screening questions prior to the visit, additional usage of staff PPE, and extensive cleaning of exam room while observing appropriate contact time as indicated for disinfecting solutions.

## 2021-05-06 NOTE — Patient Instructions (Signed)
Preventive Care 55-55 Years Old, Male Preventive care refers to lifestyle choices and visits with your health care provider that can promote health and wellness. This includes: A yearly physical exam. This is also called an annual wellness visit. Regular dental and eye exams. Immunizations. Screening for certain conditions. Healthy lifestyle choices, such as: Eating a healthy diet. Getting regular exercise. Not using drugs or products that contain nicotine and tobacco. Limiting alcohol use. What can I expect for my preventive care visit? Physical exam Your health care provider will check your: Height and weight. These may be used to calculate your BMI (body mass index). BMI is a measurement that tells if you are at a healthy weight. Heart rate and blood pressure. Body temperature. Skin for abnormal spots. Counseling Your health care provider may ask you questions about your: Past medical problems. Family's medical history. Alcohol, tobacco, and drug use. Emotional well-being. Home life and relationship well-being. Sexual activity. Diet, exercise, and sleep habits. Work and work environment. Access to firearms. What immunizations do I need? Vaccines are usually given at various ages, according to a schedule. Your health care provider will recommend vaccines for you based on your age, medical history, and lifestyle or other factors, such as travel or where you work. What tests do I need? Blood tests Lipid and cholesterol levels. These may be checked every 5 years, or more often if you are over 55 years old. Hepatitis C test. Hepatitis B test. Screening Lung cancer screening. You may have this screening every year starting at age 55 if you have a 30-pack-year history of smoking and currently smoke or have quit within the past 15 years. Prostate cancer screening. Recommendations will vary depending on your family history and other risks. Genital exam to check for testicular cancer  or hernias. Colorectal cancer screening. All adults should have this screening starting at age 55 and continuing until age 75. Your health care provider may recommend screening at age 55 if you are at increased risk. You will have tests every 1-10 years, depending on your results and the type of screening test. Diabetes screening. This is done by checking your blood sugar (glucose) after you have not eaten for a while (fasting). You may have this done every 1-3 years. STD (sexually transmitted disease) testing, if you are at risk. Follow these instructions at home: Eating and drinking  Eat a diet that includes fresh fruits and vegetables, whole grains, lean protein, and low-fat dairy products. Take vitamin and mineral supplements as recommended by your health care provider. Do not drink alcohol if your health care provider tells you not to drink. If you drink alcohol: Limit how much you have to 0-2 drinks a day. Be aware of how much alcohol is in your drink. In the [place] one drink equals one 12 oz bottle of beer (355 mL), one 5 oz glass of wine (148 mL), or one 1 oz glass of hard liquor (44 mL). Lifestyle Take daily care of your teeth and gums. Brush your teeth every morning and night with fluoride toothpaste. Floss one time each day. Stay active. Exercise for at least 30 minutes 5 or more days each week. Do not use any products that contain nicotine or tobacco, such as cigarettes, e-cigarettes, and chewing tobacco. If you need help quitting, ask your health care provider. Do not use drugs. If you are sexually active, practice safe sex. Use a condom or other form of protection to prevent STIs (sexually transmitted infections). If told by your   health care provider, take low-dose aspirin daily starting at age 55. Find healthy ways to cope with stress, such as: Meditation, yoga, or listening to music. Journaling. Talking to a trusted person. Spending time with friends and  family. Safety Always wear your seat belt while driving or riding in a vehicle. Do not drive: If you have been drinking alcohol. Do not ride with someone who has been drinking. When you are tired or distracted. While texting. Wear a helmet and other protective equipment during sports activities. If you have firearms in your house, make sure you follow all gun safety procedures. What's next? Go to your health care provider once a year for an annual wellness visit. Ask your health care provider how often you should have your eyes and teeth checked. Stay up to date on all vaccines. This information is not intended to replace advice given to you by your health care provider. Make sure you discuss any questions you have with your health care provider. Document Revised: 10/18/2020 Document Reviewed: 08/04/2018 Elsevier Patient Education  2022 Elsevier Inc.   

## 2021-05-06 NOTE — Assessment & Plan Note (Signed)
Blood pressure elevated.  He has been out of medications.  We will have him restart these and follow-up in a few weeks for blood pressure recheck.  Low-sodium diet and weight loss encouraged.

## 2021-05-07 LAB — LIPID PANEL W/REFLEX DIRECT LDL
Cholesterol: 207 mg/dL — ABNORMAL HIGH (ref <200)
HDL: 39 mg/dL — ABNORMAL LOW (ref >=40)
LDL Cholesterol (Calc): 128 mg/dL (calc) — ABNORMAL HIGH
Non-HDL Cholesterol (Calc): 168 mg/dL (calc) — ABNORMAL HIGH (ref <130)
Total CHOL/HDL Ratio: 5.3 (calc) — ABNORMAL HIGH (ref <5.0)
Triglycerides: 258 mg/dL — ABNORMAL HIGH (ref <150)

## 2021-05-07 LAB — HEPATITIS C ANTIBODY
Hepatitis C Ab: NONREACTIVE
SIGNAL TO CUT-OFF: 0.04 (ref <1.00)

## 2021-05-07 LAB — CBC WITH DIFFERENTIAL/PLATELET
Absolute Monocytes: 1043 cells/uL — ABNORMAL HIGH (ref 200–950)
Basophils Absolute: 30 cells/uL (ref 0–200)
Basophils Relative: 0.4 %
Eosinophils Absolute: 333 cells/uL (ref 15–500)
Eosinophils Relative: 4.5 %
HCT: 46.7 % (ref 38.5–50.0)
Hemoglobin: 15.7 g/dL (ref 13.2–17.1)
Lymphs Abs: 2657 cells/uL (ref 850–3900)
MCH: 32.9 pg (ref 27.0–33.0)
MCHC: 33.6 g/dL (ref 32.0–36.0)
MCV: 97.9 fL (ref 80.0–100.0)
MPV: 12.2 fL (ref 7.5–12.5)
Monocytes Relative: 14.1 %
Neutro Abs: 3337 cells/uL (ref 1500–7800)
Neutrophils Relative %: 45.1 %
Platelets: 172 10*3/uL (ref 140–400)
RBC: 4.77 10*6/uL (ref 4.20–5.80)
RDW: 13.9 % (ref 11.0–15.0)
Total Lymphocyte: 35.9 %
WBC: 7.4 10*3/uL (ref 3.8–10.8)

## 2021-05-07 LAB — COMPLETE METABOLIC PANEL WITH GFR
AG Ratio: 1.4 (calc) (ref 1.0–2.5)
ALT: 29 U/L (ref 9–46)
AST: 29 U/L (ref 10–35)
Albumin: 4.5 g/dL (ref 3.6–5.1)
Alkaline phosphatase (APISO): 83 U/L (ref 35–144)
BUN: 16 mg/dL (ref 7–25)
CO2: 25 mmol/L (ref 20–32)
Calcium: 9.3 mg/dL (ref 8.6–10.3)
Chloride: 104 mmol/L (ref 98–110)
Creat: 0.88 mg/dL (ref 0.70–1.30)
Globulin: 3.3 g/dL (calc) (ref 1.9–3.7)
Glucose, Bld: 102 mg/dL — ABNORMAL HIGH (ref 65–99)
Potassium: 3.8 mmol/L (ref 3.5–5.3)
Sodium: 137 mmol/L (ref 135–146)
Total Bilirubin: 0.6 mg/dL (ref 0.2–1.2)
Total Protein: 7.8 g/dL (ref 6.1–8.1)
eGFR: 102 mL/min/{1.73_m2} (ref >=60)

## 2021-05-07 LAB — HIV ANTIBODY (ROUTINE TESTING W REFLEX): HIV 1&2 Ab, 4th Generation: NONREACTIVE

## 2021-05-07 LAB — PSA: PSA: 0.23 ng/mL (ref <=4.00)

## 2021-05-09 ENCOUNTER — Telehealth: Payer: Self-pay

## 2021-05-09 NOTE — Telephone Encounter (Signed)
Attempted to contact patient to advise that employment [employer] form was complete and faxed.   No answer. No vm.  Doc sent to scan.

## 2021-05-14 ENCOUNTER — Other Ambulatory Visit: Payer: Self-pay | Admitting: Family Medicine

## 2021-05-14 DIAGNOSIS — I10 Essential (primary) hypertension: Secondary | ICD-10-CM

## 2021-05-15 ENCOUNTER — Other Ambulatory Visit: Payer: Self-pay | Admitting: Family Medicine

## 2021-06-12 ENCOUNTER — Other Ambulatory Visit: Payer: Self-pay | Admitting: Family Medicine

## 2021-07-26 IMAGING — US US ABDOMEN COMPLETE
1 series · 13 of 25 positions shown · non-contrast
Comparison: None.

CLINICAL DATA: 53-year-old male with artery median hepatitis and
fatty liver and cirrhosis.

EXAM:
ABDOMEN ULTRASOUND COMPLETE

[Series 1: us abdomen complete · 13 of 77 slices shown]
[im 1/77]
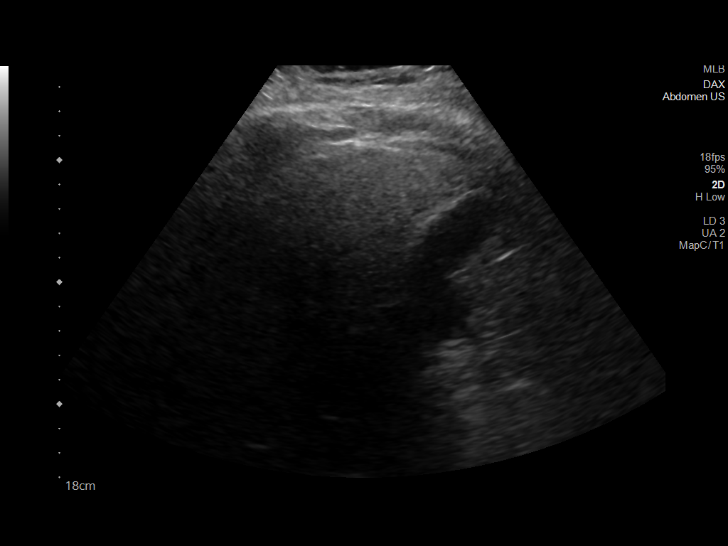
[im 7/77]
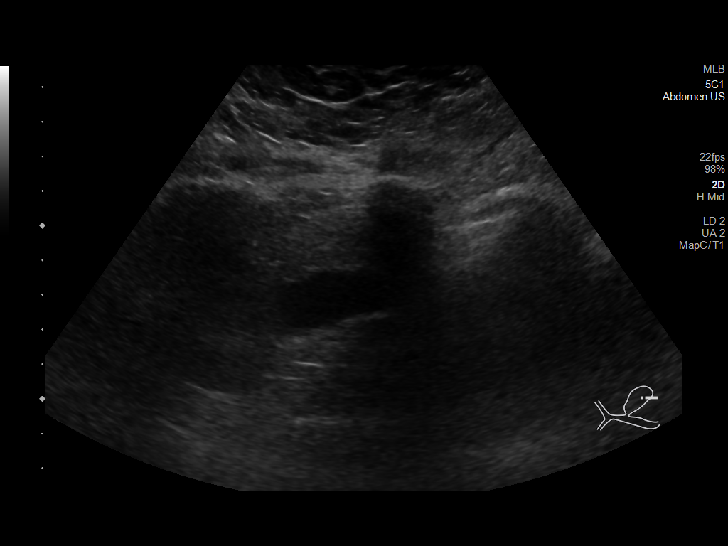
[im 13/77]
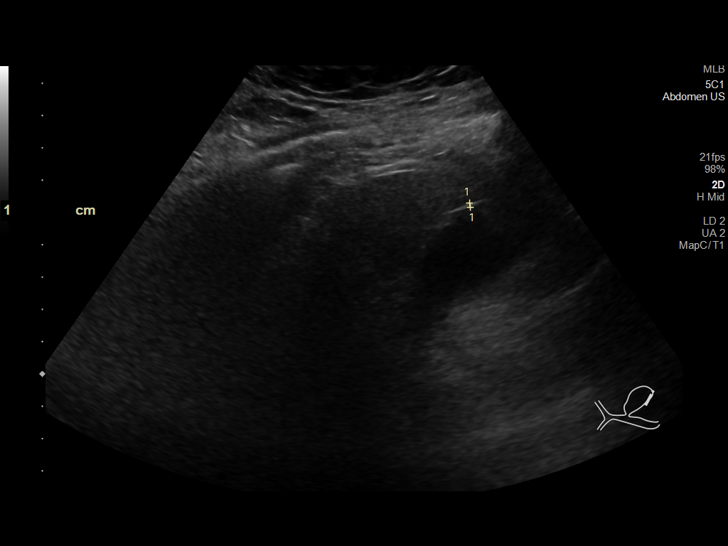
[im 20/77]
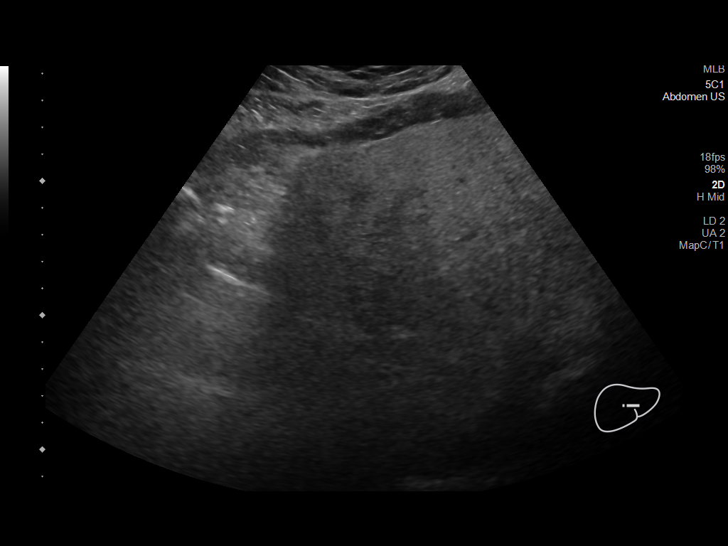
[im 26/77]
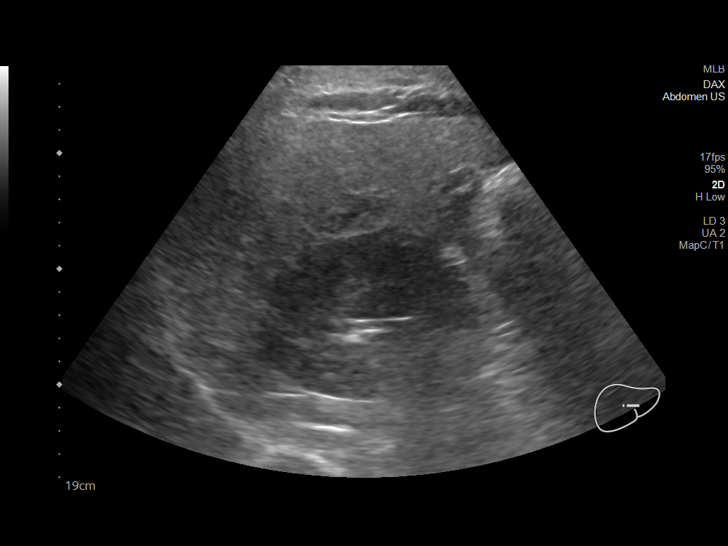
[im 32/77]
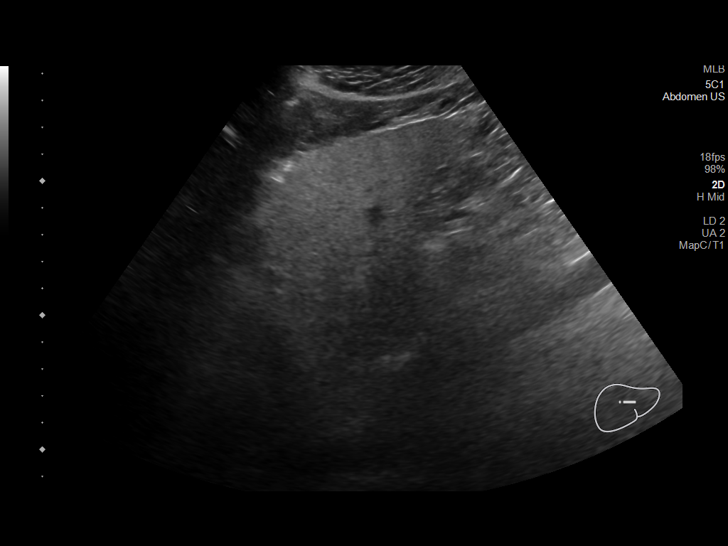
[im 39/77]
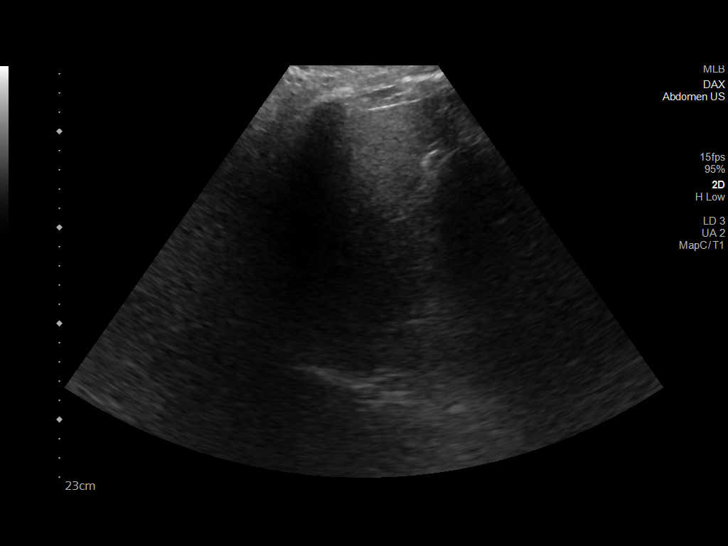
[im 45/77]
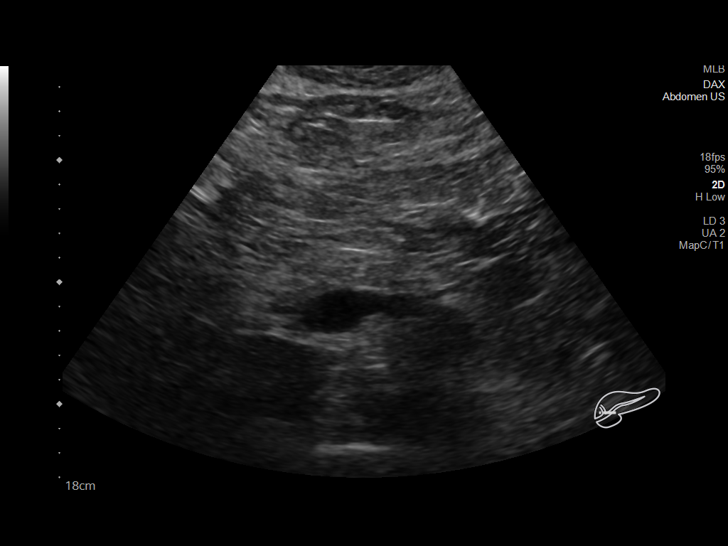
[im 51/77]
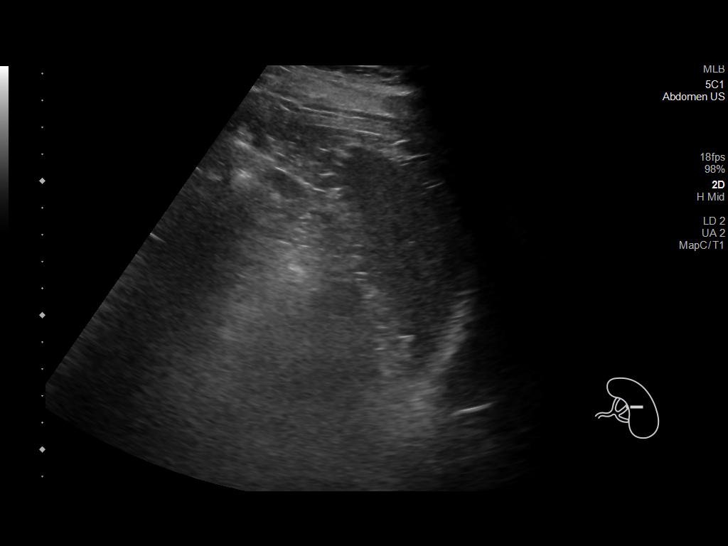
[im 58/77]
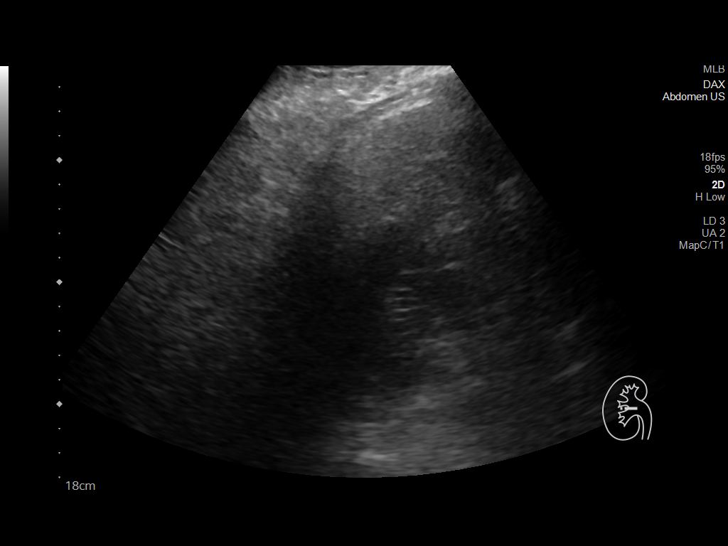
[im 64/77]
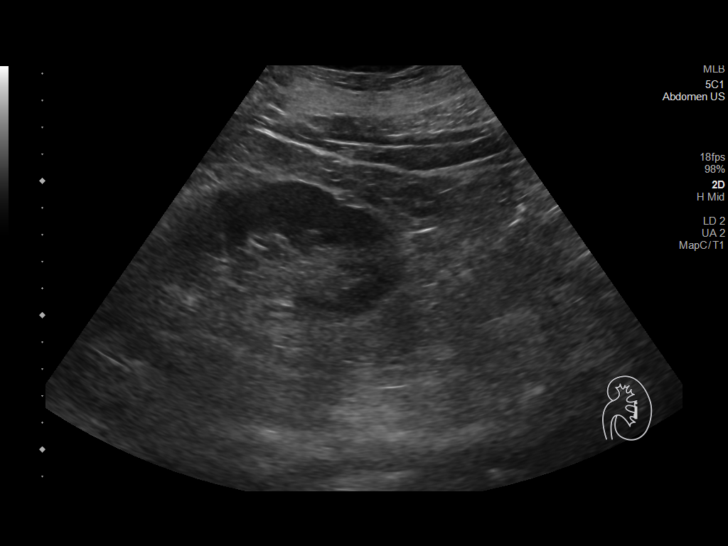
[im 70/77]
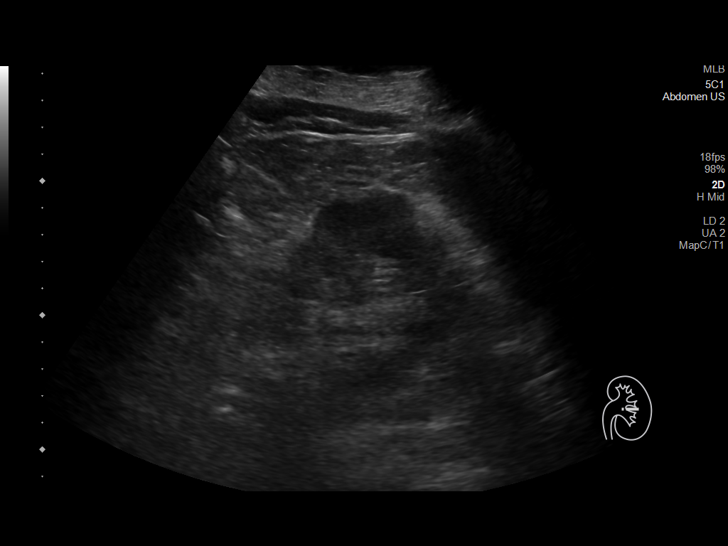
[im 77/77]
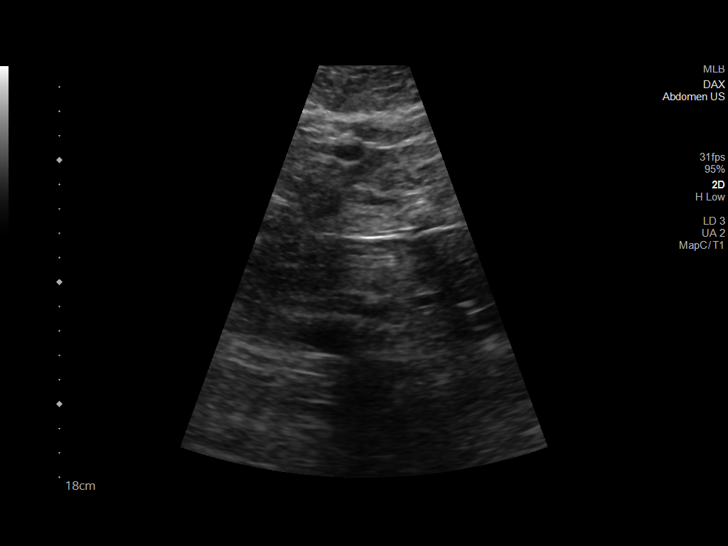

[13 of 25 positions shown; findings below may reference images not displayed]

FINDINGS: Evaluation is limited due to body habitus.

Gallbladder: No gallstones or wall thickening visualized. No
sonographic Murphy sign noted by sonographer.

Common bile duct: Diameter: 6 mm

Liver: There is diffuse increased liver echogenicity. There is
coarsened liver echotexture with mild surface irregularity in
keeping with cirrhosis. Portal vein is patent on color Doppler
imaging with normal direction of blood flow towards the liver.

IVC: No abnormality visualized.

Pancreas: The pancreas is not well visualized and obscured by bowel
gas.

Spleen: Size and appearance within normal limits.

Right Kidney: Length: 11 cm. Echogenicity within normal limits. No
mass or hydronephrosis visualized.

Left Kidney: Length: 12 cm. Echogenicity within normal limits. No
mass or hydronephrosis visualized.

Abdominal aorta: Ectatic aorta measuring up to 2.9 cm in diameter.

Other findings: None.
IMPRESSION: 1. Cirrhosis.
2. Patent main portal vein with hepatopetal flow.
3. No ascites.
4. A 2.9 cm abdominal aortic ectasia.

## 2021-07-27 ENCOUNTER — Other Ambulatory Visit: Payer: Self-pay | Admitting: Family Medicine

## 2021-07-27 DIAGNOSIS — I10 Essential (primary) hypertension: Secondary | ICD-10-CM

## 2021-08-12 ENCOUNTER — Other Ambulatory Visit: Payer: Self-pay | Admitting: Family Medicine

## 2021-10-22 ENCOUNTER — Other Ambulatory Visit: Payer: Self-pay | Admitting: Family Medicine

## 2021-10-22 DIAGNOSIS — I10 Essential (primary) hypertension: Secondary | ICD-10-CM

## 2021-10-24 ENCOUNTER — Other Ambulatory Visit: Payer: Self-pay | Admitting: Family Medicine

## 2021-11-03 ENCOUNTER — Other Ambulatory Visit: Payer: Self-pay

## 2021-11-03 ENCOUNTER — Encounter: Payer: Self-pay | Admitting: Family Medicine

## 2021-11-03 ENCOUNTER — Ambulatory Visit (INDEPENDENT_AMBULATORY_CARE_PROVIDER_SITE_OTHER): Payer: BC Managed Care – PPO | Admitting: Family Medicine

## 2021-11-03 VITALS — BP 161/77 | HR 76 | Ht 66.0 in | Wt 240.0 lb

## 2021-11-03 DIAGNOSIS — K754 Autoimmune hepatitis: Secondary | ICD-10-CM | POA: Diagnosis not present

## 2021-11-03 DIAGNOSIS — R350 Frequency of micturition: Secondary | ICD-10-CM

## 2021-11-03 DIAGNOSIS — I10 Essential (primary) hypertension: Secondary | ICD-10-CM

## 2021-11-03 MED ORDER — VALSARTAN-HYDROCHLOROTHIAZIDE 80-12.5 MG PO TABS
1.0000 | ORAL_TABLET | Freq: Every day | ORAL | 3 refills | Status: DC
Start: 2021-11-03 — End: 2022-07-29

## 2021-11-03 MED ORDER — CARVEDILOL 25 MG PO TABS: ORAL_TABLET | ORAL | 1 refills | Status: DC

## 2021-11-03 NOTE — Assessment & Plan Note (Signed)
Management per GI.  Stable with azathioprine. ?

## 2021-11-03 NOTE — Patient Instructions (Addendum)
Amlodipine may be causing increased swelling.  Discontinue and start Valsartan/HCTZ ?Continue carvedilol at current strength.   ?Return in 2 weeks to have BP check and labs.   ?

## 2021-11-03 NOTE — Assessment & Plan Note (Signed)
Orders Placed This Encounter  ?Procedures  ?? HgB A1c  ?? PSA  ?? BASIC METABOLIC PANEL WITH GFR  ? ? ?

## 2021-11-03 NOTE — Assessment & Plan Note (Signed)
He is having increased swelling with amlodipine.  Switching to valsartan/hydrochlorothiazide.  Continue carvedilol at current strength.  Low-sodium diet encouraged. ?

## 2021-11-03 NOTE — Progress Notes (Signed)
?Gregory Duarte - 56 y.o. male MRN [id number]  Date of birth: 10-Jan-1966 ? ?Subjective ?Chief Complaint  ?Patient presents with  ? Hypertension  ? Leg Swelling  ? ? ?HPI ?Gregory Duarte is a 56 year old male here today for follow-up visit.  Reports he is doing well at this time. ? ?He continues to see GI and hepatology due to history of autoimmune hepatitis with cirrhosis.  Stable at this time. ? ?Blood pressure remains elevated today.  He is taking amlodipine daily.  He has noted increased swelling since starting this.  He denies chest pain, shortness of breath, palpitations, headaches or vision changes. ? ? ?He has had urinary frequency which is worse at night.  Denies pain with urination.  No trouble starting stream. ? ?ROS:  A comprehensive ROS was completed and negative except as noted per HPI ? ? ?No Known Allergies ? ?Past Medical History:  ?Diagnosis Date  ? Autoimmune hepatitis ([place])   ? Chronic liver disease   ? Diverticulosis   ? GERD (gastroesophageal reflux disease)   ? Hypertension   ? Kidney stone   ? Sleep apnea   ? ? ?Past Surgical History:  ?Procedure Laterality Date  ? COLONOSCOPY  2017  ? [place], [place]. Dr Karren Cobble  ? ESOPHAGOGASTRODUODENOSCOPY  2019  ? [facility] Dr Karren Cobble  ? LITHOTRIPSY    ? over 10-15 years ago  ? ? ?Social History  ? ?Socioeconomic History  ? Marital status: Married  ?  Spouse name: Vong Barger  ? Number of children: Not on file  ? Years of education: Not on file  ? Highest education level: Not on file  ?Occupational History  ? Not on file  ?Tobacco Use  ? Smoking status: Former  ?  Types: Cigarettes  ?  Quit date: 37  ?  Years since quitting: 32.2  ? Smokeless tobacco: Never  ?Vaping Use  ? Vaping Use: Some days  ? Substances: CBD  ?Substance and Sexual Activity  ? Alcohol use: Not Currently  ? Drug use: Yes  ?  Types: Marijuana  ?  Comment: sometimes  ? Sexual activity: Yes  ?  Partners: Female  ?Other Topics Concern  ? Not on file  ?Social History Narrative  ? Not on file   ? ?Social Determinants of Health  ? ?Financial Resource Strain: Not on file  ?Food Insecurity: Not on file  ?Transportation Needs: Not on file  ?Physical Activity: Not on file  ?Stress: Not on file  ?Social Connections: Not on file  ? ? ?Family History  ?Problem Relation Age of Onset  ? Diabetes Mother   ? Autoimmune disease Mother   ?     Autoimmune hepatisits  ? Hypertension Father   ? Diabetes Father   ? Autoimmune disease Father   ?     Autoimmune hepatitis  ? Colon cancer Neg Hx   ? Esophageal cancer Neg Hx   ? ? ?Health Maintenance  ?Topic Date Due  ? COLONOSCOPY (Pts 45-29yrs Insurance coverage will need to be confirmed)  11/12/2025  ? TETANUS/TDAP  05/09/2030  ? INFLUENZA VACCINE  Completed  ? COVID-19 Vaccine  Completed  ? Hepatitis C Screening  Completed  ? HIV Screening  Completed  ? Zoster Vaccines- Shingrix  Completed  ? HPV VACCINES  Aged Out  ? ? ? ?----------------------------------------------------------------------------------------------------------------------------------------------------------------------------------------------------------------- ?Physical Exam ?BP (!) 161/77 (BP Location: Left Arm, Patient Position: Sitting, Cuff Size: Normal)   Pulse 76   Ht 5\' 6"  (1.676 m)   Wt 240  lb (108.9 kg)   SpO2 96%   BMI 38.74 kg/m?  ? ?Physical Exam ?Constitutional:   ?   Appearance: Normal appearance.  ?Eyes:  ?   General: No scleral icterus. ?Cardiovascular:  ?   Rate and Rhythm: Normal rate and regular rhythm.  ?Pulmonary:  ?   Effort: Pulmonary effort is normal.  ?   Breath sounds: Normal breath sounds.  ?Musculoskeletal:  ?   Cervical back: Neck supple.  ?Neurological:  ?   General: No focal deficit present.  ?   Mental Status: He is alert.  ?Psychiatric:     ?   Mood and Affect: Mood normal.     ?   Behavior: Behavior normal.   ? ? ?------------------------------------------------------------------------------------------------------------------------------------------------------------------------------------------------------------------- ?Assessment and Plan ? ?Essential hypertension ?He is having increased swelling with amlodipine.  Switching to valsartan/hydrochlorothiazide.  Continue carvedilol at current strength.  Low-sodium diet encouraged. ? ?Autoimmune hepatitis ([place]) ?Management per GI.  Stable with azathioprine. ? ?Urinary frequency ?Orders Placed This Encounter  ?Procedures  ? HgB A1c  ? PSA  ? BASIC METABOLIC PANEL WITH GFR  ? ? ? ?Meds ordered this encounter  ?Medications  ? carvedilol (COREG) 25 MG tablet  ?  Sig: TAKE 1 TABLETS BY MOUTH TWICE A DAY WITH MEALS.  ?  Dispense:  180 tablet  ?  Refill:  1  ? valsartan-hydrochlorothiazide (DIOVAN-HCT) 80-12.5 MG tablet  ?  Sig: Take 1 tablet by mouth daily.  ?  Dispense:  90 tablet  ?  Refill:  3  ? ? ?Return in about 6 months (around 05/06/2022) for HTN. ? ? ? ?This visit occurred during the SARS-CoV-2 public health emergency.  Safety protocols were in place, including screening questions prior to the visit, additional usage of staff PPE, and extensive cleaning of exam room while observing appropriate contact time as indicated for disinfecting solutions.  ? ?

## 2021-11-16 ENCOUNTER — Other Ambulatory Visit: Payer: Self-pay | Admitting: Family Medicine

## 2021-11-16 DIAGNOSIS — I10 Essential (primary) hypertension: Secondary | ICD-10-CM

## 2021-11-17 ENCOUNTER — Other Ambulatory Visit: Payer: Self-pay

## 2021-11-17 ENCOUNTER — Ambulatory Visit (INDEPENDENT_AMBULATORY_CARE_PROVIDER_SITE_OTHER): Payer: BC Managed Care – PPO | Admitting: Family Medicine

## 2021-11-17 VITALS — BP 130/73 | HR 69

## 2021-11-17 DIAGNOSIS — R350 Frequency of micturition: Secondary | ICD-10-CM | POA: Diagnosis not present

## 2021-11-17 DIAGNOSIS — I10 Essential (primary) hypertension: Secondary | ICD-10-CM | POA: Diagnosis not present

## 2021-11-17 NOTE — Progress Notes (Signed)
Medical screening examination/treatment was performed by qualified clinical staff member and as supervising physician I was immediately available for consultation/collaboration. I have reviewed documentation and agree with assessment and plan.  Aalyiah Camberos, DO  

## 2021-11-17 NOTE — Progress Notes (Signed)
? ?Established Patient Office Visit ? ?Subjective:  ?Patient ID: Gregory Duarte, male    DOB: 01-27-1966  Age: 56 y.o. MRN: [id number] ? ?CC:  ?Chief Complaint  ?Patient presents with  ? Hypertension  ? ? ?HPI ?[name] presents for hypertension. Denies chest pain shortness of breath or dizziness.  ? ?Past Medical History:  ?Diagnosis Date  ? Autoimmune hepatitis ([place])   ? Chronic liver disease   ? Diverticulosis   ? GERD (gastroesophageal reflux disease)   ? Hypertension   ? Kidney stone   ? Sleep apnea   ? ? ?Past Surgical History:  ?Procedure Laterality Date  ? COLONOSCOPY  2017  ? [place], [place]. Dr Karren Cobble  ? ESOPHAGOGASTRODUODENOSCOPY  2019  ? [facility] Dr Karren Cobble  ? LITHOTRIPSY    ? over 10-15 years ago  ? ? ?Family History  ?Problem Relation Age of Onset  ? Diabetes Mother   ? Autoimmune disease Mother   ?     Autoimmune hepatisits  ? Hypertension Father   ? Diabetes Father   ? Autoimmune disease Father   ?     Autoimmune hepatitis  ? Colon cancer Neg Hx   ? Esophageal cancer Neg Hx   ? ? ?Social History  ? ?Socioeconomic History  ? Marital status: Married  ?  Spouse name: Blase Beckner  ? Number of children: Not on file  ? Years of education: Not on file  ? Highest education level: Not on file  ?Occupational History  ? Not on file  ?Tobacco Use  ? Smoking status: Former  ?  Types: Cigarettes  ?  Quit date: 38  ?  Years since quitting: 32.2  ? Smokeless tobacco: Never  ?Vaping Use  ? Vaping Use: Some days  ? Substances: CBD  ?Substance and Sexual Activity  ? Alcohol use: Not Currently  ? Drug use: Yes  ?  Types: Marijuana  ?  Comment: sometimes  ? Sexual activity: Yes  ?  Partners: Female  ?Other Topics Concern  ? Not on file  ?Social History Narrative  ? Not on file  ? ?Social Determinants of Health  ? ?Financial Resource Strain: Not on file  ?Food Insecurity: Not on file  ?Transportation Needs: Not on file  ?Physical Activity: Not on file  ?Stress: Not on file  ?Social Connections: Not on file   ?Intimate Partner Violence: Not on file  ? ? ?Outpatient Medications Prior to Visit  ?Medication Sig Dispense Refill  ? acyclovir ointment (ZOVIRAX) 5 % Apply every 3 hours while awake.  Apply up to 4 days as needed. 30 g 1  ? azaTHIOprine (IMURAN) 50 MG tablet Take 50 mg by mouth daily.    ? carvedilol (COREG) 25 MG tablet TAKE 1 TABLETS BY MOUTH TWICE A DAY WITH MEALS. 180 tablet 1  ? ciclopirox (LOPROX) 0.77 % cream APPLY TOPICALLY TWICE A DAY 30 g 1  ? OMEPRAZOLE PO Take 1 tablet by mouth daily.    ? sildenafil (VIAGRA) 100 MG tablet TAKE 1/2 TO 1 TABLET BY MOUTH DAILY AS NEEDED FOR ERECTILE DYSFUNCTION 15 tablet 11  ? valsartan-hydrochlorothiazide (DIOVAN-HCT) 80-12.5 MG tablet Take 1 tablet by mouth daily. 90 tablet 3  ? ?No facility-administered medications prior to visit.  ? ? ?No Known Allergies ? ?ROS ?Review of Systems ? ?  ?Objective:  ?  ?Physical Exam ? ?BP 130/73   Pulse 69   SpO2 98%  ?Wt Readings from Last 3 Encounters:  ?11/03/21 240 lb (108.9  kg)  ?05/06/21 239 lb (108.4 kg)  ?06/20/20 248 lb 12.8 oz (112.9 kg)  ? ? ? ?There are no preventive care reminders to display for this patient. ? ?There are no preventive care reminders to display for this patient. ? ?No results found for: TSH ?Lab Results  ?Component Value Date  ? WBC 7.4 05/06/2021  ? HGB 15.7 05/06/2021  ? HCT 46.7 05/06/2021  ? MCV 97.9 05/06/2021  ? PLT 172 05/06/2021  ? ?Lab Results  ?Component Value Date  ? NA 137 05/06/2021  ? K 3.8 05/06/2021  ? CO2 25 05/06/2021  ? GLUCOSE 102 (H) 05/06/2021  ? BUN 16 05/06/2021  ? CREATININE 0.88 05/06/2021  ? BILITOT 0.6 05/06/2021  ? AST 29 05/06/2021  ? ALT 29 05/06/2021  ? PROT 7.8 05/06/2021  ? CALCIUM 9.3 05/06/2021  ? EGFR 102 05/06/2021  ? ?Lab Results  ?Component Value Date  ? CHOL 207 (H) 05/06/2021  ? ?Lab Results  ?Component Value Date  ? HDL 39 (L) 05/06/2021  ? ?Lab Results  ?Component Value Date  ? LDLCALC 128 (H) 05/06/2021  ? ?Lab Results  ?Component Value Date  ? TRIG 258 (H)  05/06/2021  ? ?Lab Results  ?Component Value Date  ? CHOLHDL 5.3 (H) 05/06/2021  ? ?No results found for: HGBA1C ? ?  ?Assessment & Plan:  ?Hypertension - Blood pressure within normal limits. Patient advised to continue current medications and follow up in 3 months with Dr Zigmund Daniel.  ? ?Problem List Items Addressed This Visit   ? ? Essential hypertension - Primary  ? ? ?No orders of the defined types were placed in this encounter. ? ? ?Follow-up: Return in about 3 months (around 02/17/2022) for HTN with Dr Zigmund Daniel. .  ? ? ?Gregory Duarte, [place] ?

## 2021-11-18 LAB — BASIC METABOLIC PANEL WITH GFR
BUN: 12 mg/dL (ref 7–25)
CO2: 24 mmol/L (ref 20–32)
Calcium: 9 mg/dL (ref 8.6–10.3)
Chloride: 105 mmol/L (ref 98–110)
Creat: 0.96 mg/dL (ref 0.70–1.30)
Glucose, Bld: 123 mg/dL — ABNORMAL HIGH (ref 65–99)
Potassium: 4 mmol/L (ref 3.5–5.3)
Sodium: 138 mmol/L (ref 135–146)
eGFR: 93 mL/min/{1.73_m2} (ref >=60)

## 2021-11-18 LAB — HEMOGLOBIN A1C
Hgb A1c MFr Bld: 6.2 % of total Hgb — ABNORMAL HIGH (ref <5.7)
Mean Plasma Glucose: 131 mg/dL
eAG (mmol/L): 7.3 mmol/L

## 2021-11-18 LAB — PSA: PSA: 0.6 ng/mL (ref <=4.00)

## 2021-12-01 ENCOUNTER — Other Ambulatory Visit: Payer: Self-pay | Admitting: Medical-Surgical

## 2021-12-01 MED ORDER — OZEMPIC (0.25 OR 0.5 MG/DOSE) 2 MG/3ML ~~LOC~~ SOPN
0.2500 mg | PEN_INJECTOR | SUBCUTANEOUS | 0 refills | Status: DC
Start: 2021-12-01 — End: 2022-02-03

## 2022-01-28 ENCOUNTER — Other Ambulatory Visit: Payer: Self-pay | Admitting: Family Medicine

## 2022-02-02 ENCOUNTER — Other Ambulatory Visit: Payer: Self-pay | Admitting: Family Medicine

## 2022-02-03 MED ORDER — SEMAGLUTIDE (1 MG/DOSE) 4 MG/3ML ~~LOC~~ SOPN: 1.0000 mg | PEN_INJECTOR | SUBCUTANEOUS | 1 refills | Status: DC

## 2022-02-03 NOTE — Telephone Encounter (Signed)
Increasing to 1mg .  Updated rx sent.   CM

## 2022-04-11 ENCOUNTER — Other Ambulatory Visit: Payer: Self-pay | Admitting: Family Medicine

## 2022-04-28 ENCOUNTER — Other Ambulatory Visit: Payer: Self-pay | Admitting: Family Medicine

## 2022-04-28 DIAGNOSIS — I10 Essential (primary) hypertension: Secondary | ICD-10-CM

## 2022-04-30 NOTE — Telephone Encounter (Signed)
Pls contact the patient to schedule hypertension appt. Sending 30 day med refill. Thanks

## 2022-05-01 NOTE — Telephone Encounter (Signed)
Patient is already scheduled for HTN on 9/13. Katha Hamming

## 2022-05-06 ENCOUNTER — Encounter: Payer: Self-pay | Admitting: Family Medicine

## 2022-05-06 ENCOUNTER — Ambulatory Visit (INDEPENDENT_AMBULATORY_CARE_PROVIDER_SITE_OTHER): Payer: BC Managed Care – PPO | Admitting: Family Medicine

## 2022-05-06 VITALS — BP 136/88 | HR 66 | Temp 98.7°F | Ht 66.0 in | Wt 231.1 lb

## 2022-05-06 DIAGNOSIS — R7303 Prediabetes: Secondary | ICD-10-CM | POA: Diagnosis not present

## 2022-05-06 DIAGNOSIS — I10 Essential (primary) hypertension: Secondary | ICD-10-CM

## 2022-05-06 DIAGNOSIS — Z23 Encounter for immunization: Secondary | ICD-10-CM | POA: Diagnosis not present

## 2022-05-06 LAB — POCT GLYCOSYLATED HEMOGLOBIN (HGB A1C): HbA1c, POC (controlled diabetic range): 5.7 % (ref 0.0–7.0)

## 2022-05-06 NOTE — Patient Instructions (Signed)
Continue current medications F/u in 6 months.

## 2022-05-06 NOTE — Assessment & Plan Note (Signed)
BP is fairly well controlled with current medications.  Recommend continuation. F/u in 6 months.

## 2022-05-06 NOTE — Progress Notes (Signed)
Gregory Duarte - 56 y.o. male MRN [id number]  Date of birth: 1965-10-11  Subjective Chief Complaint  Patient presents with   Hypertension    HPI Gregory Duarte is a 56 y.o. male here today for follow up of of HTN and Obesity.   Continues on ozempic for blood sugar and weight management.  Overall he is doing well with this.  He does have some mild transient nausea.  Does get appetite suppression and weight is down about 10lbs since last visit.   BP is managed with valsartan/hctz and carvedilol.  Doing well with these medications.  Denies side effects at this time.  Has not had chest pain, shortness of breath ,palpitations ,headache or vision changes.   ROS:  A comprehensive ROS was completed and negative except as noted per HPI  No Known Allergies  Past Medical History:  Diagnosis Date   Autoimmune hepatitis ([facility])    Chronic liver disease    Diverticulosis    GERD (gastroesophageal reflux disease)    Hypertension    Kidney stone    Sleep apnea     Past Surgical History:  Procedure Laterality Date   COLONOSCOPY  2017   [place], [place]. Dr Blaine Hamper   ESOPHAGOGASTRODUODENOSCOPY  [address] Dr Blaine Hamper   LITHOTRIPSY     over 10-15 years ago    Social History   Socioeconomic History   Marital status: Married    Spouse name: Charlie Seda   Number of children: Not on file   Years of education: Not on file   Highest education level: Not on file  Occupational History   Not on file  Tobacco Use   Smoking status: Former    Types: Cigarettes    Quit date: 83    Years since quitting: 32.7   Smokeless tobacco: Never  Vaping Use   Vaping Use: Some days   Substances: CBD  Substance and Sexual Activity   Alcohol use: Not Currently   Drug use: Yes    Types: Marijuana    Comment: sometimes   Sexual activity: Yes    Partners: Female  Other Topics Concern   Not on file  Social History Narrative   Not on file   Social Determinants of Health   Financial  Resource Strain: Not on file  Food Insecurity: Not on file  Transportation Needs: Not on file  Physical Activity: Not on file  Stress: Not on file  Social Connections: Not on file    Family History  Problem Relation Age of Onset   Diabetes Mother    Autoimmune disease Mother        Autoimmune hepatisits   Hypertension Father    Diabetes Father    Autoimmune disease Father        Autoimmune hepatitis   Colon cancer Neg Hx    Esophageal cancer Neg Hx     Health Maintenance  Topic Date Due   COVID-19 Vaccine (6 - Pfizer risk series) 09/24/2022 (Originally 08/09/2021)   COLONOSCOPY (Pts 45-81yrs Insurance coverage will need to be confirmed)  11/12/2025   TETANUS/TDAP  05/09/2030   INFLUENZA VACCINE  Completed   Hepatitis C Screening  Completed   HIV Screening  Completed   Zoster Vaccines- Shingrix  Completed   HPV VACCINES  Aged Out     ----------------------------------------------------------------------------------------------------------------------------------------------------------------------------------------------------------------- Physical Exam BP 136/88 (BP Location: Left Arm, Patient Position: Sitting, Cuff Size: Normal)   Pulse 66   Temp 98.7 F (37.1 C) (Oral)   Ht 5'  6" (1.676 m)   Wt 231 lb 1.9 oz (104.8 kg)   SpO2 97%   BMI 37.30 kg/m   Physical Exam Constitutional:      Appearance: Normal appearance.  Eyes:     General: No scleral icterus. Cardiovascular:     Rate and Rhythm: Normal rate and regular rhythm.  Pulmonary:     Effort: Pulmonary effort is normal.     Breath sounds: Normal breath sounds.  Musculoskeletal:     Cervical back: Neck supple.  Neurological:     Mental Status: He is alert.  Psychiatric:        Mood and Affect: Mood normal.        Behavior: Behavior normal.      ------------------------------------------------------------------------------------------------------------------------------------------------------------------------------------------------------------------- Assessment and Plan  Essential hypertension BP is fairly well controlled with current medications.  Recommend continuation. F/u in 6 months.   Prediabetes Doing well with ozempic.  Continue at current strength.  A1c is improved today.   Lab Results  Component Value Date   HGBA1C 5.7 05/06/2022     No orders of the defined types were placed in this encounter.   Return in about 6 months (around 11/04/2022) for HTN/Prediabetes.    This visit occurred during the SARS-CoV-2 public health emergency.  Safety protocols were in place, including screening questions prior to the visit, additional usage of staff PPE, and extensive cleaning of exam room while observing appropriate contact time as indicated for disinfecting solutions.

## 2022-05-06 NOTE — Assessment & Plan Note (Addendum)
Doing well with ozempic.  Continue at current strength.  A1c is improved today.   Lab Results  Component Value Date   HGBA1C 5.7 05/06/2022

## 2022-05-18 ENCOUNTER — Encounter: Payer: Self-pay | Admitting: Family Medicine

## 2022-05-18 ENCOUNTER — Other Ambulatory Visit: Payer: Self-pay | Admitting: Family Medicine

## 2022-05-18 DIAGNOSIS — K754 Autoimmune hepatitis: Secondary | ICD-10-CM

## 2022-05-18 MED ORDER — AZATHIOPRINE 50 MG PO TABS
50.0000 mg | ORAL_TABLET | Freq: Every day | ORAL | 0 refills | Status: DC
Start: 2022-05-18 — End: 2022-08-10

## 2022-05-18 NOTE — Telephone Encounter (Signed)
Should come from GI as they are managing his auto-immune hepatitis.

## 2022-05-18 NOTE — Telephone Encounter (Signed)
Will fill this once but seen by [facility] previously.  He should be able to re-establish with their [facility] location.

## 2022-05-18 NOTE — Telephone Encounter (Signed)
Patient keeps requesting this medication to be refilled. He is asking should he continue with this medication.   Never prescribed by Dr Zigmund Daniel.

## 2022-05-18 NOTE — Telephone Encounter (Signed)
Needs to come from GI.  They are treating his auto-immune hepatitis.

## 2022-05-19 ENCOUNTER — Other Ambulatory Visit: Payer: Self-pay | Admitting: Family Medicine

## 2022-06-01 ENCOUNTER — Other Ambulatory Visit: Payer: Self-pay | Admitting: Family Medicine

## 2022-06-01 DIAGNOSIS — I10 Essential (primary) hypertension: Secondary | ICD-10-CM

## 2022-06-05 ENCOUNTER — Other Ambulatory Visit: Payer: Self-pay | Admitting: Family Medicine

## 2022-06-05 DIAGNOSIS — I10 Essential (primary) hypertension: Secondary | ICD-10-CM

## 2022-06-21 ENCOUNTER — Other Ambulatory Visit: Payer: Self-pay | Admitting: Family Medicine

## 2022-07-07 ENCOUNTER — Other Ambulatory Visit: Payer: Self-pay | Admitting: Family Medicine

## 2022-07-07 DIAGNOSIS — I10 Essential (primary) hypertension: Secondary | ICD-10-CM

## 2022-07-19 ENCOUNTER — Other Ambulatory Visit: Payer: Self-pay | Admitting: Family Medicine

## 2022-07-21 ENCOUNTER — Encounter: Payer: Self-pay | Admitting: Family Medicine

## 2022-07-21 ENCOUNTER — Other Ambulatory Visit: Payer: Self-pay | Admitting: Family Medicine

## 2022-07-23 MED ORDER — OZEMPIC (1 MG/DOSE) 4 MG/3ML ~~LOC~~ SOPN: PEN_INJECTOR | SUBCUTANEOUS | 1 refills | Status: DC

## 2022-07-29 ENCOUNTER — Telehealth: Payer: Self-pay

## 2022-07-29 MED ORDER — VALSARTAN-HYDROCHLOROTHIAZIDE 80-12.5 MG PO TABS: 1.0000 | ORAL_TABLET | Freq: Every day | ORAL | 3 refills | Status: DC

## 2022-07-29 NOTE — Telephone Encounter (Signed)
[facility] called for an rx fill on valsartin

## 2022-07-30 ENCOUNTER — Other Ambulatory Visit: Payer: Self-pay | Admitting: Family Medicine

## 2022-07-30 ENCOUNTER — Other Ambulatory Visit: Payer: Self-pay

## 2022-07-30 DIAGNOSIS — I10 Essential (primary) hypertension: Secondary | ICD-10-CM

## 2022-07-30 MED ORDER — SILDENAFIL CITRATE 100 MG PO TABS: ORAL_TABLET | ORAL | 3 refills | Status: DC

## 2022-08-07 ENCOUNTER — Encounter: Payer: Self-pay | Admitting: Family Medicine

## 2022-08-07 DIAGNOSIS — I10 Essential (primary) hypertension: Secondary | ICD-10-CM

## 2022-08-07 MED ORDER — CARVEDILOL 25 MG PO TABS: 25.0000 mg | ORAL_TABLET | Freq: Two times a day (BID) | ORAL | 0 refills | Status: DC

## 2022-08-10 ENCOUNTER — Other Ambulatory Visit: Payer: Self-pay | Admitting: Family Medicine

## 2022-08-10 ENCOUNTER — Other Ambulatory Visit: Payer: Self-pay

## 2022-08-10 DIAGNOSIS — I10 Essential (primary) hypertension: Secondary | ICD-10-CM

## 2022-08-10 MED ORDER — CARVEDILOL 25 MG PO TABS: 25.0000 mg | ORAL_TABLET | Freq: Two times a day (BID) | ORAL | 0 refills | Status: DC

## 2022-09-02 ENCOUNTER — Other Ambulatory Visit: Payer: Self-pay | Admitting: Family Medicine

## 2022-09-15 ENCOUNTER — Encounter: Payer: Self-pay | Admitting: Family Medicine

## 2022-09-15 ENCOUNTER — Ambulatory Visit (INDEPENDENT_AMBULATORY_CARE_PROVIDER_SITE_OTHER): Payer: BC Managed Care – PPO | Admitting: Family Medicine

## 2022-09-15 VITALS — BP 137/81 | HR 85 | Ht 70.0 in | Wt 220.1 lb

## 2022-09-15 DIAGNOSIS — I10 Essential (primary) hypertension: Secondary | ICD-10-CM

## 2022-09-15 DIAGNOSIS — R7303 Prediabetes: Secondary | ICD-10-CM | POA: Diagnosis not present

## 2022-09-15 DIAGNOSIS — B356 Tinea cruris: Secondary | ICD-10-CM | POA: Diagnosis not present

## 2022-09-15 MED ORDER — CLOTRIMAZOLE-BETAMETHASONE 1-0.05 % EX CREA: 1.0000 | TOPICAL_CREAM | Freq: Every day | CUTANEOUS | 0 refills | Status: DC

## 2022-09-15 NOTE — Addendum Note (Signed)
Addended by: Perlie Mayo on: 09/15/2022 08:38 AM   Modules accepted: Orders

## 2022-09-15 NOTE — Progress Notes (Signed)
Gregory Duarte - 57 y.o. male MRN [id number]  Date of birth: 03-24-1966  Subjective Chief Complaint  Patient presents with   Follow-up    HPI Gregory Duarte is a 57 y.o. male here today for follow up visit.   Continues on valsartan/hctz and carvedilol for management of HTN.  Reports that he is doing well.  BP is pretty well controlled. Marland Kitchen  He denies side effects from medication.  He has not had chest pain, shortness of breath, palpitations, headache or vision changes.   He has been using Ozempic for prediabetes and weight loss.  He has done pretty well with this.  He does get occasional nausea.  Weight is down about 11lbs since last visit.   He does have concerns about bumps on the scrotum.  No pain with this.  He does have some itching at times.  Denies drainage.  There is some scaling around lesions.    ROS:  A comprehensive ROS was completed and negative except as noted per HPI  No Known Allergies  Past Medical History:  Diagnosis Date   Autoimmune hepatitis ([place])    Chronic liver disease    Diverticulosis    GERD (gastroesophageal reflux disease)    Hypertension    Kidney stone    Sleep apnea     Past Surgical History:  Procedure Laterality Date   COLONOSCOPY  2017   [place], [place]. Dr Karren Cobble   ESOPHAGOGASTRODUODENOSCOPY  [address] Dr Karren Cobble   LITHOTRIPSY     over 10-15 years ago    Social History   Socioeconomic History   Marital status: Married    Spouse name: Naresh Althaus   Number of children: Not on file   Years of education: Not on file   Highest education level: Not on file  Occupational History   Not on file  Tobacco Use   Smoking status: Former    Types: Cigarettes    Quit date: 65    Years since quitting: 33.0   Smokeless tobacco: Never  Vaping Use   Vaping Use: Some days   Substances: CBD  Substance and Sexual Activity   Alcohol use: Not Currently   Drug use: Yes    Types: Marijuana    Comment: sometimes   Sexual activity:  Yes    Partners: Female  Other Topics Concern   Not on file  Social History Narrative   Not on file   Social Determinants of Health   Financial Resource Strain: Not on file  Food Insecurity: Not on file  Transportation Needs: Not on file  Physical Activity: Not on file  Stress: Not on file  Social Connections: Not on file    Family History  Problem Relation Age of Onset   Diabetes Mother    Autoimmune disease Mother        Autoimmune hepatisits   Hypertension Father    Diabetes Father    Autoimmune disease Father        Autoimmune hepatitis   Colon cancer Neg Hx    Esophageal cancer Neg Hx     Health Maintenance  Topic Date Due   COVID-19 Vaccine (6 - 2023-24 season) 09/24/2022 (Originally 04/24/2022)   COLONOSCOPY (Pts 45-79yrs Insurance coverage will need to be confirmed)  11/12/2025   DTaP/Tdap/Td (2 - Td or Tdap) 05/09/2030   INFLUENZA VACCINE  Completed   Hepatitis C Screening  Completed   HIV Screening  Completed   Zoster Vaccines- Shingrix  Completed   HPV  VACCINES  Aged Out     ----------------------------------------------------------------------------------------------------------------------------------------------------------------------------------------------------------------- Physical Exam BP 137/81 (BP Location: Left Arm, Patient Position: Sitting, Cuff Size: Large)   Pulse 85   Ht 5\' 10"  (1.778 m)   Wt 220 lb 1.9 oz (99.8 kg)   SpO2 97%   BMI 31.58 kg/m   Physical Exam Constitutional:      Appearance: Normal appearance.  HENT:     Head: Normocephalic and atraumatic.  Eyes:     General: No scleral icterus. Cardiovascular:     Rate and Rhythm: Normal rate and regular rhythm.  Pulmonary:     Effort: Pulmonary effort is normal.     Breath sounds: Normal breath sounds.  Genitourinary:    Comments: Slightly raised erythematous papules on scrotum, scaly edges.  Neurological:     Mental Status: He is alert.  Psychiatric:        Mood and  Affect: Mood normal.        Behavior: Behavior normal.     ------------------------------------------------------------------------------------------------------------------------------------------------------------------------------------------------------------------- Assessment and Plan  Prediabetes He has done well with Ozempic for management of glucose and weight.  Will continue at current strength.   Tinea cruris Adding lotrisone.  He will let me know if not improving with this.   Essential hypertension Bp remains fairly well controlled.  Continue current medications.    Meds ordered this encounter  Medications   clotrimazole-betamethasone (LOTRISONE) cream    Sig: Apply 1 Application topically daily.    Dispense:  30 g    Refill:  0    Return in about 3 months (around 12/15/2022) for Annual exam.    This visit occurred during the SARS-CoV-2 public health emergency.  Safety protocols were in place, including screening questions prior to the visit, additional usage of staff PPE, and extensive cleaning of exam room while observing appropriate contact time as indicated for disinfecting solutions.

## 2022-09-15 NOTE — Assessment & Plan Note (Signed)
Adding lotrisone.  He will let me know if not improving with this.

## 2022-09-15 NOTE — Patient Instructions (Signed)
Try lotrisone cream.  Let me know if not improving with this.

## 2022-09-15 NOTE — Assessment & Plan Note (Signed)
He has done well with Ozempic for management of glucose and weight.  Will continue at current strength.

## 2022-09-15 NOTE — Assessment & Plan Note (Signed)
Bp remains fairly well controlled.  Continue current medications.

## 2022-09-22 ENCOUNTER — Other Ambulatory Visit (INDEPENDENT_AMBULATORY_CARE_PROVIDER_SITE_OTHER): Payer: BC Managed Care – PPO

## 2022-09-22 ENCOUNTER — Encounter: Payer: Self-pay | Admitting: Gastroenterology

## 2022-09-22 ENCOUNTER — Ambulatory Visit (INDEPENDENT_AMBULATORY_CARE_PROVIDER_SITE_OTHER): Payer: BC Managed Care – PPO | Admitting: Gastroenterology

## 2022-09-22 VITALS — BP 122/80 | HR 76 | Ht 66.0 in | Wt 218.6 lb

## 2022-09-22 DIAGNOSIS — K7460 Unspecified cirrhosis of liver: Secondary | ICD-10-CM | POA: Diagnosis not present

## 2022-09-22 DIAGNOSIS — Z86010 Personal history of colonic polyps: Secondary | ICD-10-CM

## 2022-09-22 DIAGNOSIS — K219 Gastro-esophageal reflux disease without esophagitis: Secondary | ICD-10-CM

## 2022-09-22 DIAGNOSIS — K754 Autoimmune hepatitis: Secondary | ICD-10-CM

## 2022-09-22 LAB — PROTIME-INR
INR: 1 ratio (ref 0.8–1.0)
Prothrombin Time: 11.5 s (ref 9.6–13.1)

## 2022-09-22 LAB — COMPREHENSIVE METABOLIC PANEL
ALT: 17 U/L (ref 0–53)
AST: 20 U/L (ref 0–37)
Albumin: 4.4 g/dL (ref 3.5–5.2)
Alkaline Phosphatase: 57 U/L (ref 39–117)
BUN: 14 mg/dL (ref 6–23)
CO2: 28 mEq/L (ref 19–32)
Calcium: 9.8 mg/dL (ref 8.4–10.5)
Chloride: 101 mEq/L (ref 96–112)
Creatinine, Ser: 1 mg/dL (ref 0.40–1.50)
GFR: 84.2 mL/min (ref >60.00)
Glucose, Bld: 88 mg/dL (ref 70–99)
Potassium: 3.6 mEq/L (ref 3.5–5.1)
Sodium: 138 mEq/L (ref 135–145)
Total Bilirubin: 0.5 mg/dL (ref 0.2–1.2)
Total Protein: 8.1 g/dL (ref 6.0–8.3)

## 2022-09-22 LAB — HEMOGLOBIN A1C: Hgb A1c MFr Bld: 5.6 % (ref 4.6–6.5)

## 2022-09-22 NOTE — Progress Notes (Signed)
Chief Complaint: FU  Referring Provider:  Luetta Nutting, DO      ASSESSMENT AND PLAN;   #1. 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  No portal hypertension  #2. H/O Polyps on colonoscopy 2017 @[place] .  Repeat in 5 years  #3. GERD. S/P EGD 2017.   Plan: -CBC, CMP, AFP, PT INR, HBA1c -Colon after holding ozempic 3 days prior -Check HBsAb titer and HAV total Ab.  If neg, would recommend vaccination for A and B. -Continue same meds including omeprazole/Imuran. -[place] Abdo complete. -Start exercising and reduce weight.   -FU in 6 months.    Discussed risks & benefits of colonoscopy. Risks including rare perforation req laparotomy, bleeding after bx/polypectomy req blood transfusion, rarely missing neoplasms, risks of anesthesia/sedation, rare risk of damage to internal organs. Benefits outweigh the risks. Patient agrees to proceed. All the questions were answered. Pt consents to proceed.   HPI:    Gregory Duarte is a 57 y.o. male  With obesity, HTN, OSA, DM on Ozempic  Here for follow-up.  Dx AIH 87yrs ago, initially treated with prednisone and Imuran.  Has been on imuran eversince.  Has been tried to wean off.  Unfortunately when he went to 25 mg of Imuran (over 6 to 7 years ago), his liver's function tests started increasing.  It was decided to continue him on Imuran 50 mg p.o. once a day.  Both parents had autoimmune hepatitis.  Most recently he has been diagnosed as having liver cirrhosis due to autoimmune hepatitis vs NAFLD vs both.  No nausea, vomiting, heartburn (as long as he takes omeprazole once a day), regurgitation, odynophagia or dysphagia.  No significant diarrhea or constipation. No melena or hematochezia. No unintentional weight loss. No abdominal pain.  No H/O itching, skin lesions, easy bruisability, intake of OTC meds including diet pills, herbal medications, anabolic steroids or Tylenol. There is no H/O blood transfusions,  IVDA. No jaundice, dark urine or pale stools. No alcohol abuse.  Has reduced weight  Wt Readings from Last 3 Encounters:  09/22/22 218 lb 9.6 oz (99.2 kg)  09/15/22 220 lb 1.9 oz (99.8 kg)  05/06/22 231 lb 1.9 oz (104.8 kg)      Past Medical History:  Diagnosis Date   Autoimmune hepatitis ([place])    Chronic liver disease    Diverticulosis    GERD (gastroesophageal reflux disease)    Hypertension    Kidney stone    Sleep apnea     Past Surgical History:  Procedure Laterality Date   COLONOSCOPY  2017   [place], [place]. Dr Karren Cobble   ESOPHAGOGASTRODUODENOSCOPY  [address] Dr Karren Cobble   LITHOTRIPSY     over 10-15 years ago    Family History  Problem Relation Age of Onset   Diabetes Mother    Autoimmune disease Mother        Autoimmune hepatisits   Hypertension Father    Diabetes Father    Autoimmune disease Father        Autoimmune hepatitis   Colon cancer Neg Hx    Esophageal cancer Neg Hx     Social History   Tobacco Use   Smoking status: Former    Types: Cigarettes    Quit date: 1991    Years since quitting: 33.1   Smokeless tobacco: Never  Vaping Use   Vaping Use: Some days   Substances: CBD  Substance Use Topics   Alcohol use: Not Currently   Drug use: Yes  Types: Marijuana    Comment: sometimes    Current Outpatient Medications  Medication Sig Dispense Refill   acyclovir ointment (ZOVIRAX) 5 % Apply every 3 hours while awake.  Apply up to 4 days as needed. 30 g 1   azaTHIOprine (IMURAN) 50 MG tablet TAKE 1 TABLET BY MOUTH EVERY DAY 30 tablet 0   carvedilol (COREG) 25 MG tablet Take 1 tablet (25 mg total) by mouth 2 (two) times daily with a meal. 180 tablet 0   ciclopirox (LOPROX) 0.77 % cream APPLY TO AFFECTED AREA TWICE A DAY 30 g 0   clotrimazole-betamethasone (LOTRISONE) cream Apply 1 Application topically daily. 30 g 0   OMEPRAZOLE PO Take 1 tablet by mouth daily.     Semaglutide, 1 MG/DOSE, (OZEMPIC, 1 MG/DOSE,) 4 MG/3ML SOPN  INJECT 1 MG ONCE A WEEK AS DIRECTED 9 mL 1   sildenafil (VIAGRA) 100 MG tablet TAKE 1/2 TO 1 TABLET BY MOUTH DAILY AS NEEDED FOR ERECTILE DYSFUNCTION 15 tablet 3   valsartan-hydrochlorothiazide (DIOVAN-HCT) 80-12.5 MG tablet Take 1 tablet by mouth daily. 90 tablet 3   No current facility-administered medications for this visit.    No Known Allergies  Review of Systems:  Constitutional: Denies fever, chills, diaphoresis, appetite change and fatigue.  HEENT: Denies photophobia, eye pain.  Has allergies. Respiratory: Denies SOB, DOE, cough, chest tightness,  and wheezing.   Cardiovascular: Denies chest pain, palpitations and leg swelling.  Genitourinary: Denies dysuria, urgency, frequency, hematuria, flank pain and difficulty urinating.  Has excessive urination. Musculoskeletal: Denies myalgias, back pain, joint swelling, arthralgias and gait problem.  Skin: No rash.  Neurological: Denies dizziness, seizures, syncope, weakness, light-headedness, numbness and headaches.  Hematological: Denies adenopathy. Easy bruising, personal or family bleeding history  Psychiatric/Behavioral: No anxiety or depression     Physical Exam:    BP 122/80   Pulse 76   Ht 5\' 6"  (1.676 m)   Wt 218 lb 9.6 oz (99.2 kg)   SpO2 96%   BMI 35.28 kg/m  Wt Readings from Last 3 Encounters:  09/22/22 218 lb 9.6 oz (99.2 kg)  09/15/22 220 lb 1.9 oz (99.8 kg)  05/06/22 231 lb 1.9 oz (104.8 kg)   Constitutional:  Well-developed, in no acute distress. Psychiatric: Normal mood and affect. Behavior is normal. HEENT: Pupils normal.  Conjunctivae are normal. No scleral icterus. Cardiovascular: Normal rate, regular rhythm. No edema Pulmonary/chest: Effort normal and breath sounds normal. No wheezing, rales or rhonchi. Abdominal: Soft, nondistended. Nontender. Bowel sounds active throughout. There are no masses palpable.  Liver is palpated 2 cm below the costal margin. Rectal:  defered Neurological: Alert and oriented  to person place and time. Skin: Skin is warm and dry. No rashes noted.   Carmell Austria, MD 09/22/2022, 4:12 PM  Cc: Luetta Nutting, DO

## 2022-09-22 NOTE — Patient Instructions (Signed)
_______________________________________________________  If your blood pressure at your visit was 140/90 or greater, please contact your primary care physician to follow up on this.  _______________________________________________________  If you are age 57 or older, your body mass index should be between 23-30. Your Body mass index is 35.28 kg/m. If this is out of the aforementioned range listed, please consider follow up with your Primary Care Provider.  If you are age 51 or younger, your body mass index should be between 19-25. Your Body mass index is 35.28 kg/m. If this is out of the aformentioned range listed, please consider follow up with your Primary Care Provider.   ________________________________________________________  The  GI providers would like to encourage you to use [facility] to communicate with providers for non-urgent requests or questions.  Due to long hold times on the telephone, sending your provider a message by [facility] may be a faster and more efficient way to get a response.  Please allow 48 business hours for a response.  Please remember that this is for non-urgent requests.  _______________________________________________________  Your provider has requested that you go to the basement level for lab work before leaving today. Press "B" on the elevator. The lab is located at the first door on the left as you exit the elevator.  You have been scheduled for a colonoscopy. Please follow written instructions given to you at your visit today.  Please pick up your prep supplies at the pharmacy within the next 1-3 days. If you use inhalers (even only as needed), please bring them with you on the day of your procedure.  You have been scheduled for an abdominal ultrasound at [facility] (1st floor of hospital) on 09-29-2022 at 8am. Please arrive 15 minutes prior to your appointment for registration. Make certain not to have anything to eat or drink midnight  prior to your appointment. Should you need to reschedule your appointment, please contact radiology at [phone]. This test typically takes about 30 minutes to perform.  Please follow up in 6 months. Give [place] a call at [phone] to schedule an appointment.  Thank you,  Dr. Jackquline Denmark

## 2022-09-23 LAB — CBC WITH DIFFERENTIAL/PLATELET
Basophils Absolute: 0.1 10*3/uL (ref 0.0–0.1)
Basophils Relative: 0.7 % (ref 0.0–3.0)
Eosinophils Absolute: 0.3 10*3/uL (ref 0.0–0.7)
Eosinophils Relative: 3.2 % (ref 0.0–5.0)
HCT: 48.1 % (ref 39.0–52.0)
Hemoglobin: 16.8 g/dL (ref 13.0–17.0)
Lymphocytes Relative: 29.2 % (ref 12.0–46.0)
Lymphs Abs: 2.5 10*3/uL (ref 0.7–4.0)
MCHC: 34.9 g/dL (ref 30.0–36.0)
MCV: 101 fl — ABNORMAL HIGH (ref 78.0–100.0)
Monocytes Absolute: 1 10*3/uL (ref 0.1–1.0)
Monocytes Relative: 11.5 % (ref 3.0–12.0)
Neutro Abs: 4.9 10*3/uL (ref 1.4–7.7)
Neutrophils Relative %: 55.4 % (ref 43.0–77.0)
Platelets: 134 10*3/uL — ABNORMAL LOW (ref 150.0–400.0)
RBC: 4.76 Mil/uL (ref 4.22–5.81)
RDW: 15.3 % (ref 11.5–15.5)
WBC: 8.7 10*3/uL (ref 4.0–10.5)

## 2022-09-24 LAB — HEPATITIS A ANTIBODY, TOTAL: Hepatitis A AB,Total: REACTIVE — AB

## 2022-09-24 LAB — HEPATITIS B SURFACE ANTIBODY,QUALITATIVE: Hep B S Ab: NONREACTIVE

## 2022-09-24 LAB — AFP TUMOR MARKER: AFP-Tumor Marker: 6.9 ng/mL — ABNORMAL HIGH (ref <6.1)

## 2022-09-25 ENCOUNTER — Other Ambulatory Visit: Payer: Self-pay | Admitting: Family Medicine

## 2022-09-29 ENCOUNTER — Ambulatory Visit (HOSPITAL_COMMUNITY)
Admission: RE | Admit: 2022-09-29 | Discharge: 2022-09-29 | Disposition: A | Discharge status: Home / Self Care | Payer: BC Managed Care – PPO | Source: Ambulatory Visit | Attending: Gastroenterology | Admitting: Gastroenterology

## 2022-09-29 DIAGNOSIS — K754 Autoimmune hepatitis: Secondary | ICD-10-CM | POA: Diagnosis not present

## 2022-09-29 DIAGNOSIS — Z86010 Personal history of colonic polyps: Secondary | ICD-10-CM | POA: Insufficient documentation

## 2022-09-29 DIAGNOSIS — K7460 Unspecified cirrhosis of liver: Secondary | ICD-10-CM | POA: Diagnosis not present

## 2022-09-29 DIAGNOSIS — K219 Gastro-esophageal reflux disease without esophagitis: Secondary | ICD-10-CM | POA: Insufficient documentation

## 2022-10-14 ENCOUNTER — Other Ambulatory Visit: Payer: Self-pay

## 2022-10-21 ENCOUNTER — Other Ambulatory Visit: Payer: Self-pay | Admitting: Family Medicine

## 2022-10-26 ENCOUNTER — Other Ambulatory Visit: Payer: Self-pay

## 2022-10-26 DIAGNOSIS — R772 Abnormality of alphafetoprotein: Secondary | ICD-10-CM

## 2022-10-26 DIAGNOSIS — K754 Autoimmune hepatitis: Secondary | ICD-10-CM

## 2022-10-26 DIAGNOSIS — K7460 Unspecified cirrhosis of liver: Secondary | ICD-10-CM

## 2022-10-30 ENCOUNTER — Other Ambulatory Visit: Payer: Self-pay | Admitting: Family Medicine

## 2022-10-30 MED ORDER — OZEMPIC (1 MG/DOSE) 4 MG/3ML ~~LOC~~ SOPN: PEN_INJECTOR | SUBCUTANEOUS | 1 refills | Status: DC

## 2022-11-04 ENCOUNTER — Ambulatory Visit (INDEPENDENT_AMBULATORY_CARE_PROVIDER_SITE_OTHER): Payer: BC Managed Care – PPO | Admitting: Family Medicine

## 2022-11-04 ENCOUNTER — Encounter: Payer: Self-pay | Admitting: Family Medicine

## 2022-11-04 VITALS — BP 130/74 | HR 78 | Ht 66.0 in | Wt 217.0 lb

## 2022-11-04 DIAGNOSIS — I10 Essential (primary) hypertension: Secondary | ICD-10-CM

## 2022-11-04 DIAGNOSIS — Z125 Encounter for screening for malignant neoplasm of prostate: Secondary | ICD-10-CM | POA: Diagnosis not present

## 2022-11-04 DIAGNOSIS — E785 Hyperlipidemia, unspecified: Secondary | ICD-10-CM

## 2022-11-04 DIAGNOSIS — R7303 Prediabetes: Secondary | ICD-10-CM

## 2022-11-04 DIAGNOSIS — K7460 Unspecified cirrhosis of liver: Secondary | ICD-10-CM | POA: Diagnosis not present

## 2022-11-04 NOTE — Assessment & Plan Note (Signed)
Doing well with Ozempic.  A1c improved at last visit.  Weight stable.  He will continue this at current strength in addition to making dietary changes.

## 2022-11-04 NOTE — Assessment & Plan Note (Signed)
Followed by GI.  Upcoming MR of the liver.

## 2022-11-04 NOTE — Progress Notes (Signed)
Gregory Duarte - 57 y.o. male MRN [id number]  Date of birth: [birth year]  Subjective Chief Complaint  Patient presents with   Diabetes   Hypertension    HPI Gregory Duarte is a 57 y.o. male here today for follow up visit.   He reports that he is doing pretty well.   Visit with GI in January for continued monitoring of autoimmune hepatitis and cirrhosis.  This has remained stable.  MR of the liver has been ordered as well.   BP is managed with coreg and valsartan/hctz.  Doing well at current strength and denies side effects.  He has not had chest pain, shortness of breath, palpitations, headache or vision changes.   He remains on Ozempic for blood glucose control.  Denies significant side effects with this.  Last A1c down to 5.6%.   ROS:  A comprehensive ROS was completed and negative except as noted per HPI  No Known Allergies  Past Medical History:  Diagnosis Date   Autoimmune hepatitis ([place])    Chronic liver disease    Diverticulosis    GERD (gastroesophageal reflux disease)    Hypertension    Kidney stone    Sleep apnea     Past Surgical History:  Procedure Laterality Date   COLONOSCOPY  2017   [place], [place]. Dr Karren Cobble   ESOPHAGOGASTRODUODENOSCOPY  [address] Dr Karren Cobble   LITHOTRIPSY     over 10-15 years ago    Social History   Socioeconomic History   Marital status: Married    Spouse name: Gregory Duarte   Number of children: Not on file   Years of education: Not on file   Highest education level: Not on file  Occupational History   Not on file  Tobacco Use   Smoking status: Former    Types: Cigarettes    Quit date: 52    Years since quitting: 33.2   Smokeless tobacco: Never  Vaping Use   Vaping Use: Some days   Substances: CBD  Substance and Sexual Activity   Alcohol use: Not Currently   Drug use: Yes    Types: Marijuana    Comment: sometimes   Sexual activity: Yes    Partners: Female  Other Topics Concern   Not on file  Social  History Narrative   Not on file   Social Determinants of Health   Financial Resource Strain: Not on file  Food Insecurity: Not on file  Transportation Needs: Not on file  Physical Activity: Not on file  Stress: Not on file  Social Connections: Not on file    Family History  Problem Relation Age of Onset   Diabetes Mother    Autoimmune disease Mother        Autoimmune hepatisits   Hypertension Father    Diabetes Father    Autoimmune disease Father        Autoimmune hepatitis   Colon cancer Neg Hx    Esophageal cancer Neg Hx     Health Maintenance  Topic Date Due   COVID-19 Vaccine (6 - 2023-24 season) 11/20/2023 (Originally 04/24/2022)   COLONOSCOPY (Pts 45-28yr Insurance coverage will need to be confirmed)  11/12/2025   DTaP/Tdap/Td (2 - Td or Tdap) 05/09/2030   INFLUENZA VACCINE  Completed   Hepatitis C Screening  Completed   HIV Screening  Completed   Zoster Vaccines- Shingrix  Completed   HPV VACCINES  Aged Out     ----------------------------------------------------------------------------------------------------------------------------------------------------------------------------------------------------------------- Physical Exam BP 130/74 (BP Location: Left Arm,  Patient Position: Sitting, Cuff Size: Normal)   Pulse 78   Ht '5\' 6"'$  (1.676 m)   Wt 217 lb (98.4 kg)   SpO2 97%   BMI 35.02 kg/m   Physical Exam Constitutional:      Appearance: Normal appearance.  HENT:     Head: Normocephalic and atraumatic.  Eyes:     General: No scleral icterus. Cardiovascular:     Rate and Rhythm: Normal rate and regular rhythm.  Pulmonary:     Effort: Pulmonary effort is normal.     Breath sounds: Normal breath sounds.  Musculoskeletal:     Cervical back: Neck supple.  Neurological:     Mental Status: He is alert.  Psychiatric:        Mood and Affect: Mood normal.        Behavior: Behavior normal.      ------------------------------------------------------------------------------------------------------------------------------------------------------------------------------------------------------------------- Assessment and Plan  Essential hypertension BP is well controlled at this time.  Recommend continuation of current medications.  Follow up in 6 months.   Cirrhosis of liver ([place]) Followed by GI.  Upcoming MR of the liver.   Prediabetes Doing well with Ozempic.  A1c improved at last visit.  Weight stable.  He will continue this at current strength in addition to making dietary changes.   Elevated lipids Recheck lipid panel today.    No orders of the defined types were placed in this encounter.   Return in about 6 months (around 05/07/2023) for HTN/Prediabetes.    This visit occurred during the SARS-CoV-2 public health emergency.  Safety protocols were in place, including screening questions prior to the visit, additional usage of staff PPE, and extensive cleaning of exam room while observing appropriate contact time as indicated for disinfecting solutions.

## 2022-11-04 NOTE — Assessment & Plan Note (Signed)
BP is well controlled at this time.  Recommend continuation of current medications.  Follow up in 6 months.

## 2022-11-04 NOTE — Assessment & Plan Note (Signed)
Recheck lipid panel today. 

## 2022-11-05 LAB — LIPID PANEL W/REFLEX DIRECT LDL
Cholesterol: 181 mg/dL (ref <200)
HDL: 36 mg/dL — ABNORMAL LOW (ref >=40)
LDL Cholesterol (Calc): 99 mg/dL (calc)
Non-HDL Cholesterol (Calc): 145 mg/dL (calc) — ABNORMAL HIGH (ref <130)
Total CHOL/HDL Ratio: 5 (calc) — ABNORMAL HIGH (ref <5.0)
Triglycerides: 346 mg/dL — ABNORMAL HIGH (ref <150)

## 2022-11-05 LAB — PSA: PSA: 0.43 ng/mL (ref <=4.00)

## 2022-11-06 ENCOUNTER — Other Ambulatory Visit: Payer: Self-pay

## 2022-11-06 DIAGNOSIS — I10 Essential (primary) hypertension: Secondary | ICD-10-CM

## 2022-11-06 MED ORDER — CARVEDILOL 25 MG PO TABS: 25.0000 mg | ORAL_TABLET | Freq: Two times a day (BID) | ORAL | 1 refills | Status: DC

## 2022-11-17 ENCOUNTER — Encounter: Payer: BC Managed Care – PPO | Admitting: Gastroenterology

## 2022-12-18 ENCOUNTER — Ambulatory Visit (INDEPENDENT_AMBULATORY_CARE_PROVIDER_SITE_OTHER): Payer: BC Managed Care – PPO | Admitting: Family Medicine

## 2022-12-18 ENCOUNTER — Encounter: Payer: Self-pay | Admitting: Family Medicine

## 2022-12-18 VITALS — BP 130/75 | HR 68 | Ht 66.0 in | Wt 213.0 lb

## 2022-12-18 DIAGNOSIS — Z0000 Encounter for general adult medical examination without abnormal findings: Secondary | ICD-10-CM | POA: Diagnosis not present

## 2022-12-18 NOTE — Patient Instructions (Signed)

## 2022-12-18 NOTE — Progress Notes (Signed)
Gregory Duarte - 57 y.o. male MRN [id number]  Date of birth: Oct 28, 1965  Subjective Chief Complaint  Patient presents with   Annual Exam    HPI Gregory Duarte is a 57 y.o. male here today for annual exam.   He reports that he is doing well.   His activity level is improved.   He feels that diet is pretty good.   He is a non-smoker.  Rare EtOH use.   Continues to see his liver doctor regularly and has upcoming MRI.   ROS:  A comprehensive ROS was completed and negative except as noted per HPI  No Known Allergies  Past Medical History:  Diagnosis Date   Autoimmune hepatitis ([facility])    Chronic liver disease    Diverticulosis    GERD (gastroesophageal reflux disease)    Hypertension    Kidney stone    Sleep apnea     Past Surgical History:  Procedure Laterality Date   COLONOSCOPY  2017   [place], [place]. Dr Blaine Hamper   ESOPHAGOGASTRODUODENOSCOPY  [address] Dr Blaine Hamper   LITHOTRIPSY     over 10-15 years ago    Social History   Socioeconomic History   Marital status: Married    Spouse name: Claxton Levitz   Number of children: Not on file   Years of education: Not on file   Highest education level: Not on file  Occupational History   Not on file  Tobacco Use   Smoking status: Former    Types: Cigarettes    Quit date: 46    Years since quitting: 33.3   Smokeless tobacco: Never  Vaping Use   Vaping Use: Some days   Substances: CBD  Substance and Sexual Activity   Alcohol use: Not Currently   Drug use: Yes    Types: Marijuana    Comment: sometimes   Sexual activity: Yes    Partners: Female  Other Topics Concern   Not on file  Social History Narrative   Not on file   Social Determinants of Health   Financial Resource Strain: Not on file  Food Insecurity: Not on file  Transportation Needs: Not on file  Physical Activity: Not on file  Stress: Not on file  Social Connections: Not on file    Family History  Problem Relation Age of Onset    Diabetes Mother    Autoimmune disease Mother        Autoimmune hepatisits   Hypertension Father    Diabetes Father    Autoimmune disease Father        Autoimmune hepatitis   Colon cancer Neg Hx    Esophageal cancer Neg Hx     Health Maintenance  Topic Date Due   COVID-19 Vaccine (6 - 2023-24 season) 11/20/2023 (Originally 04/24/2022)   INFLUENZA VACCINE  03/25/2023   COLONOSCOPY (Pts 45-50yrs Insurance coverage will need to be confirmed)  11/12/2025   DTaP/Tdap/Td (2 - Td or Tdap) 05/09/2030   Hepatitis C Screening  Completed   HIV Screening  Completed   Zoster Vaccines- Shingrix  Completed   HPV VACCINES  Aged Out     ----------------------------------------------------------------------------------------------------------------------------------------------------------------------------------------------------------------- Physical Exam BP 130/75 (BP Location: Left Arm, Patient Position: Sitting, Cuff Size: Normal)   Pulse 68   Ht 5\' 6"  (1.676 m)   Wt 213 lb (96.6 kg)   SpO2 99%   BMI 34.38 kg/m   Physical Exam Constitutional:      Appearance: Normal appearance.  HENT:  Head: Normocephalic and atraumatic.  Eyes:     General: No scleral icterus. Cardiovascular:     Rate and Rhythm: Normal rate and regular rhythm.  Pulmonary:     Effort: Pulmonary effort is normal.     Breath sounds: Normal breath sounds.  Neurological:     General: No focal deficit present.     Mental Status: He is alert.  Psychiatric:        Mood and Affect: Mood normal.        Behavior: Behavior normal.     ------------------------------------------------------------------------------------------------------------------------------------------------------------------------------------------------------------------- Assessment and Plan  Well adult exam Well adult Screenings: UTD Immunizations: UTD Anticipatory guidance/Risk factor reduction:  Recommendations per AVS.    No orders of  the defined types were placed in this encounter.   Return in about 6 months (around 06/19/2023) for HTN.    This visit occurred during the SARS-CoV-2 public health emergency.  Safety protocols were in place, including screening questions prior to the visit, additional usage of staff PPE, and extensive cleaning of exam room while observing appropriate contact time as indicated for disinfecting solutions.

## 2022-12-18 NOTE — Assessment & Plan Note (Signed)
Well adult Screenings: UTD Immunizations: UTD Anticipatory guidance/Risk factor reduction:  Recommendations per AVS.  

## 2022-12-21 ENCOUNTER — Ambulatory Visit (INDEPENDENT_AMBULATORY_CARE_PROVIDER_SITE_OTHER): Payer: BC Managed Care – PPO

## 2022-12-21 DIAGNOSIS — K754 Autoimmune hepatitis: Secondary | ICD-10-CM | POA: Diagnosis not present

## 2022-12-21 DIAGNOSIS — R772 Abnormality of alphafetoprotein: Secondary | ICD-10-CM | POA: Diagnosis not present

## 2022-12-21 DIAGNOSIS — K7460 Unspecified cirrhosis of liver: Secondary | ICD-10-CM | POA: Diagnosis not present

## 2022-12-21 MED ORDER — GADOBUTROL 1 MMOL/ML IV SOLN
10.0000 mL | Freq: Once | INTRAVENOUS | Status: AC | PRN
  Administered 2022-12-21: 10 mL via INTRAVENOUS

## 2022-12-23 ENCOUNTER — Other Ambulatory Visit: Payer: Self-pay

## 2022-12-23 DIAGNOSIS — K7460 Unspecified cirrhosis of liver: Secondary | ICD-10-CM

## 2022-12-23 DIAGNOSIS — R772 Abnormality of alphafetoprotein: Secondary | ICD-10-CM

## 2023-01-20 ENCOUNTER — Other Ambulatory Visit: Payer: Self-pay | Admitting: Family Medicine

## 2023-01-21 ENCOUNTER — Other Ambulatory Visit (HOSPITAL_COMMUNITY): Payer: Self-pay

## 2023-01-21 ENCOUNTER — Other Ambulatory Visit: Payer: Self-pay

## 2023-01-21 MED ORDER — CICLOPIROX OLAMINE 0.77 % EX CREA
TOPICAL_CREAM | Freq: Two times a day (BID) | CUTANEOUS | 0 refills | Status: DC
  Filled 2023-01-21 – 2023-02-24 (×2): qty 30, 30d supply, fill #0

## 2023-01-22 ENCOUNTER — Other Ambulatory Visit: Payer: Self-pay

## 2023-01-22 ENCOUNTER — Other Ambulatory Visit (HOSPITAL_COMMUNITY): Payer: Self-pay

## 2023-01-22 ENCOUNTER — Encounter: Payer: Self-pay | Admitting: Pharmacist

## 2023-01-25 ENCOUNTER — Other Ambulatory Visit: Payer: Self-pay

## 2023-02-24 ENCOUNTER — Encounter: Payer: Self-pay | Admitting: Pharmacist

## 2023-02-24 ENCOUNTER — Other Ambulatory Visit: Payer: Self-pay

## 2023-03-02 ENCOUNTER — Other Ambulatory Visit: Payer: Self-pay

## 2023-03-03 ENCOUNTER — Other Ambulatory Visit: Payer: Self-pay | Admitting: Family Medicine

## 2023-03-11 ENCOUNTER — Other Ambulatory Visit: Payer: Self-pay | Admitting: Family Medicine

## 2023-03-11 DIAGNOSIS — I10 Essential (primary) hypertension: Secondary | ICD-10-CM

## 2023-03-11 MED ORDER — CARVEDILOL 25 MG PO TABS: 25.0000 mg | ORAL_TABLET | Freq: Two times a day (BID) | ORAL | 1 refills | Status: DC

## 2023-03-11 MED ORDER — VALSARTAN-HYDROCHLOROTHIAZIDE 80-12.5 MG PO TABS: 1.0000 | ORAL_TABLET | Freq: Every day | ORAL | 3 refills | Status: DC

## 2023-03-11 MED ORDER — SILDENAFIL CITRATE 100 MG PO TABS: ORAL_TABLET | ORAL | 3 refills | Status: DC

## 2023-03-11 MED ORDER — AZATHIOPRINE 50 MG PO TABS: 50.0000 mg | ORAL_TABLET | Freq: Every day | ORAL | 1 refills | Status: DC

## 2023-03-11 MED ORDER — OZEMPIC (1 MG/DOSE) 4 MG/3ML ~~LOC~~ SOPN: PEN_INJECTOR | SUBCUTANEOUS | 1 refills | Status: DC

## 2023-03-11 MED ORDER — CLOTRIMAZOLE-BETAMETHASONE 1-0.05 % EX CREA: 1.0000 | TOPICAL_CREAM | Freq: Every day | CUTANEOUS | 0 refills | Status: DC

## 2023-03-11 MED ORDER — CICLOPIROX OLAMINE 0.77 % EX CREA: TOPICAL_CREAM | Freq: Two times a day (BID) | CUTANEOUS | 0 refills | Status: DC

## 2023-03-12 ENCOUNTER — Other Ambulatory Visit: Payer: Self-pay | Admitting: Family Medicine

## 2023-03-30 ENCOUNTER — Other Ambulatory Visit: Payer: Self-pay | Admitting: Family Medicine

## 2023-03-30 DIAGNOSIS — E119 Type 2 diabetes mellitus without complications: Secondary | ICD-10-CM | POA: Diagnosis not present

## 2023-03-30 LAB — HM DIABETES EYE EXAM

## 2023-05-10 ENCOUNTER — Ambulatory Visit (INDEPENDENT_AMBULATORY_CARE_PROVIDER_SITE_OTHER): Payer: BC Managed Care – PPO | Admitting: Family Medicine

## 2023-05-10 ENCOUNTER — Encounter: Payer: Self-pay | Admitting: Family Medicine

## 2023-05-10 VITALS — BP 122/75 | HR 72 | Ht 66.0 in | Wt 210.0 lb

## 2023-05-10 DIAGNOSIS — I10 Essential (primary) hypertension: Secondary | ICD-10-CM | POA: Diagnosis not present

## 2023-05-10 DIAGNOSIS — R7303 Prediabetes: Secondary | ICD-10-CM

## 2023-05-10 DIAGNOSIS — K754 Autoimmune hepatitis: Secondary | ICD-10-CM | POA: Diagnosis not present

## 2023-05-10 DIAGNOSIS — Z23 Encounter for immunization: Secondary | ICD-10-CM

## 2023-05-10 LAB — POCT GLYCOSYLATED HEMOGLOBIN (HGB A1C): HbA1c, POC (prediabetic range): 5.3 % — AB (ref 5.7–6.4)

## 2023-05-10 NOTE — Assessment & Plan Note (Signed)
BP is well controlled at this time.  Recommend continuation of current medications.  Follow up in 6 months.

## 2023-05-10 NOTE — Assessment & Plan Note (Addendum)
Doing well with Ozempic.  Weight  is down a few pounds.  He will continue this at current strength in addition to making dietary changes.
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
## 2023-05-10 NOTE — Assessment & Plan Note (Signed)
Management per GI.  Stable with azathioprine. ?

## 2023-05-11 LAB — POCT UA - MICROALBUMIN
Albumin/Creatinine Ratio, Urine, POC: <30
Creatinine, POC: 300 mg/dL
Microalbumin Ur, POC: 10 mg/L

## 2023-05-11 NOTE — Addendum Note (Signed)
Addended by: Ardyth Man on: 05/11/2023 08:04 AM   Modules accepted: Orders

## 2023-05-13 ENCOUNTER — Encounter: Payer: Self-pay | Admitting: Family Medicine

## 2023-06-03 ENCOUNTER — Other Ambulatory Visit: Payer: Self-pay | Admitting: Family Medicine

## 2023-06-03 ENCOUNTER — Encounter: Payer: Self-pay | Admitting: Family Medicine

## 2023-06-03 DIAGNOSIS — I10 Essential (primary) hypertension: Secondary | ICD-10-CM

## 2023-06-03 MED ORDER — OZEMPIC (1 MG/DOSE) 4 MG/3ML ~~LOC~~ SOPN: PEN_INJECTOR | SUBCUTANEOUS | 1 refills | Status: DC

## 2023-06-07 ENCOUNTER — Other Ambulatory Visit: Payer: Self-pay | Admitting: Family Medicine

## 2023-06-23 ENCOUNTER — Ambulatory Visit: Payer: BC Managed Care – PPO | Admitting: Family Medicine

## 2023-07-15 ENCOUNTER — Encounter: Payer: Self-pay | Admitting: Family Medicine

## 2023-07-15 ENCOUNTER — Ambulatory Visit: Payer: BC Managed Care – PPO

## 2023-07-15 ENCOUNTER — Ambulatory Visit (INDEPENDENT_AMBULATORY_CARE_PROVIDER_SITE_OTHER): Payer: BC Managed Care – PPO | Admitting: Family Medicine

## 2023-07-15 VITALS — BP 146/79 | HR 71 | Ht 66.0 in | Wt 214.0 lb

## 2023-07-15 DIAGNOSIS — N50812 Left testicular pain: Secondary | ICD-10-CM | POA: Diagnosis not present

## 2023-07-15 DIAGNOSIS — N433 Hydrocele, unspecified: Secondary | ICD-10-CM | POA: Diagnosis not present

## 2023-07-15 DIAGNOSIS — N5089 Other specified disorders of the male genital organs: Secondary | ICD-10-CM

## 2023-07-15 MED ORDER — SULFAMETHOXAZOLE-TRIMETHOPRIM 800-160 MG PO TABS: 1.0000 | ORAL_TABLET | Freq: Two times a day (BID) | ORAL | 0 refills | Status: AC

## 2023-07-15 NOTE — Progress Notes (Signed)
Gregory Duarte - 57 y.o. male MRN [id number]  Date of birth: Jan 02, 1966  Subjective Chief Complaint  Patient presents with   Testicle Pain    HPI Gregory Duarte is a 57 y.o. male here today with complaint of testicular pain.  Pain began a few days ago.  He does have swelling associated with this.  He does not recall any injury.  He has not had dysuria, penile discharge, fever or chills.  ROS:  A comprehensive ROS was completed and negative except as noted per HPI  No Known Allergies  Past Medical History:  Diagnosis Date   Autoimmune hepatitis (HCC)    Chronic liver disease    Diverticulosis    GERD (gastroesophageal reflux disease)    Hypertension    Kidney stone    Sleep apnea     Past Surgical History:  Procedure Laterality Date   COLONOSCOPY  2017   [place], [place]. Dr Blaine Hamper   ESOPHAGOGASTRODUODENOSCOPY  2019   [facility] Dr Blaine Hamper   LITHOTRIPSY     over 10-15 years ago    Social History   Socioeconomic History   Marital status: Married    Spouse name: Carlens Merker   Number of children: Not on file   Years of education: Not on file   Highest education level: Bachelor's degree (e.g., BA, AB, BS)  Occupational History   Not on file  Tobacco Use   Smoking status: Former    Current packs/day: 0.00    Types: Cigarettes    Quit date: 1991    Years since quitting: 33.9   Smokeless tobacco: Never  Vaping Use   Vaping status: Some Days   Substances: CBD  Substance and Sexual Activity   Alcohol use: Not Currently   Drug use: Yes    Types: Marijuana    Comment: sometimes   Sexual activity: Yes    Partners: Female  Other Topics Concern   Not on file  Social History Narrative   Not on file   Social Determinants of Health   Financial Resource Strain: Low Risk  (07/15/2023)   Overall Financial Resource Strain (CARDIA)    Difficulty of Paying Living Expenses: Not hard at all  Food Insecurity: No Food Insecurity (07/15/2023)   Hunger Vital Sign     Worried About Running Out of Food in the Last Year: Never true    Ran Out of Food in the Last Year: Never true  Transportation Needs: No Transportation Needs (07/15/2023)   PRAPARE - Administrator, Civil Service (Medical): No    Lack of Transportation (Non-Medical): No  Physical Activity: Insufficiently Active (07/15/2023)   Exercise Vital Sign    Days of Exercise per Week: 2 days    Minutes of Exercise per Session: 10 min  Stress: No Stress Concern Present (07/15/2023)   [employer] of Occupational Health - Occupational Stress Questionnaire    Feeling of Stress : Not at all  Social Connections: Moderately Isolated (07/15/2023)   Social Connection and Isolation Panel [NHANES]    Frequency of Communication with Friends and Family: More than three times a week    Frequency of Social Gatherings with Friends and Family: Once a week    Attends Religious Services: Never    Database administrator or Organizations: No    Attends Engineer, structural: Not on file    Marital Status: Married    Family History  Problem Relation Age of Onset   Diabetes Mother  Autoimmune disease Mother        Autoimmune hepatisits   Hypertension Father    Diabetes Father    Autoimmune disease Father        Autoimmune hepatitis   Colon cancer Neg Hx    Esophageal cancer Neg Hx     Health Maintenance  Topic Date Due   COVID-19 Vaccine (7 - 2023-24 season) 08/04/2023   Colonoscopy  11/12/2025   DTaP/Tdap/Td (2 - Td or Tdap) 05/09/2030   INFLUENZA VACCINE  Completed   Hepatitis C Screening  Completed   HIV Screening  Completed   Zoster Vaccines- Shingrix  Completed   HPV VACCINES  Aged Out     ----------------------------------------------------------------------------------------------------------------------------------------------------------------------------------------------------------------- Physical Exam BP (!) 146/79 (BP Location: Right Arm, Patient Position:  Sitting, Cuff Size: Normal)   Pulse 71   Ht 5\' 6"  (1.676 m)   Wt 214 lb (97.1 kg)   SpO2 97%   BMI 34.54 kg/m   Physical Exam Constitutional:      Appearance: Normal appearance.  Eyes:     General: No scleral icterus. Genitourinary:    Comments: Left testicle with swelling and tenderness.  Tenderness more along the epididymal head. Neurological:     Mental Status: He is alert.  Psychiatric:        Mood and Affect: Mood normal.        Behavior: Behavior normal.     ------------------------------------------------------------------------------------------------------------------------------------------------------------------------------------------------------------------- Assessment and Plan  Testicular swelling, left Suspicion for epididymitis.  Ultrasound scrotum ordered.  Epididymitis confirmed on ultrasound.  Will treat with Bactrim x 2 weeks.  Red flags and precautions discussed.   Meds ordered this encounter  Medications   sulfamethoxazole-trimethoprim (BACTRIM DS) 800-160 MG tablet    Sig: Take 1 tablet by mouth 2 (two) times daily for 14 days.    Dispense:  28 tablet    Refill:  0    No follow-ups on file.    This visit occurred during the SARS-CoV-2 public health emergency.  Safety protocols were in place, including screening questions prior to the visit, additional usage of staff PPE, and extensive cleaning of exam room while observing appropriate contact time as indicated for disinfecting solutions.

## 2023-07-15 NOTE — Assessment & Plan Note (Addendum)
Suspicion for epididymitis.  Ultrasound scrotum ordered.  Epididymitis confirmed on ultrasound.  Will treat with Bactrim x 2 weeks.  Red flags and precautions discussed.

## 2023-07-16 LAB — URINALYSIS, ROUTINE W REFLEX MICROSCOPIC
Bilirubin, UA: NEGATIVE
Glucose, UA: NEGATIVE
Ketones, UA: NEGATIVE
Leukocytes,UA: NEGATIVE
Nitrite, UA: NEGATIVE
Protein,UA: NEGATIVE
RBC, UA: NEGATIVE
Specific Gravity, UA: 1.021 (ref 1.005–1.030)
Urobilinogen, Ur: 1 mg/dL (ref 0.2–1.0)
pH, UA: 6.5 (ref 5.0–7.5)

## 2023-07-18 LAB — URINE CULTURE: Organism ID, Bacteria: NO GROWTH

## 2023-08-29 ENCOUNTER — Other Ambulatory Visit: Payer: Self-pay | Admitting: Family Medicine

## 2023-09-08 ENCOUNTER — Other Ambulatory Visit: Payer: Self-pay | Admitting: Family Medicine

## 2023-09-08 NOTE — Telephone Encounter (Signed)
 Requesting rx rf of valsartan  / hydrochlorothiazide  Last written 03/11/2023 Last OV 07/15/2023 ( sick visit)  Upcoming appt = 11/08/2023

## 2023-10-21 ENCOUNTER — Ambulatory Visit (INDEPENDENT_AMBULATORY_CARE_PROVIDER_SITE_OTHER): Payer: BC Managed Care – PPO | Admitting: Family Medicine

## 2023-10-21 VITALS — BP 149/84 | HR 64 | Ht 66.0 in | Wt 215.5 lb

## 2023-10-21 DIAGNOSIS — I10 Essential (primary) hypertension: Secondary | ICD-10-CM | POA: Diagnosis not present

## 2023-10-21 DIAGNOSIS — B001 Herpesviral vesicular dermatitis: Secondary | ICD-10-CM

## 2023-10-21 MED ORDER — VALSARTAN-HYDROCHLOROTHIAZIDE 160-25 MG PO TABS: 1.0000 | ORAL_TABLET | Freq: Every day | ORAL | 3 refills | Status: DC

## 2023-10-21 NOTE — Assessment & Plan Note (Signed)
 We discussed supplementing with L-lysine, approximately 1000 mg/day.  Reduce arginine intake.  Continue Zovirax as needed.  If he continues to have recurrent outbreaks we will plan to consider oral Valtrex for suppressive therapy.
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
## 2023-10-21 NOTE — Patient Instructions (Addendum)
 Try adding 1000mg  of L-Lysine and reduce intake of foods high in arginine.  Increase valsartan/hydrochlorothiazide to 160/25mg .     Nuts Many nuts are sources of arginine. This includes:  Walnuts Hazelnuts Pecans Peanuts Almonds Cashews [place] Nuts The arginine content in nuts makes them high in protein, but nuts are also great sources of fiber and essential vitamins.  Seeds Seeds have a significant amount of arginine. Pumpkin seeds have one of the highest concentrations of arginine. One cup of dried pumpkin seeds contains 6.905 grams of the amino acid. Other seeds with a high amount of arginine include watermelon, sesame, and sunflower seeds. Dairy [employer] products such as milk, yogurt, and cheese are all sources of arginine. Dairy foods provide many other key nutrients, such as:  Protein Calcium Magnesium Folate B1, B2, B6, and B12 Vitamins A, D, and E One study found that consuming a high-protein diet containing dairy products is associated with a lower body mass index.  Meat As one of the best sources of protein, meat contains all of the amino acids your body needs, including arginine. White meat has the highest amount of arginine. Its health benefits come from being rich in protein and low in fat. One [place] breast has 16.207 grams of arginine, while one cup of chicken has 2.790 grams. Beef contains 4.131 grams of arginine per pound of cooked meat. Although it also has high levels of arginine, it has more fat than other sources of protein.  Whole Grains Whole grains are a rich source of arginine and are believed to help lower blood cholesterol. Their antioxidant properties may also decrease your risk of heart disease. Grains with a high amount of arginine include: Oats Corn Buckwheat [employer]

## 2023-10-21 NOTE — Assessment & Plan Note (Signed)
 Blood pressure is elevated.  Will increase valsartan/hydrochlorothiazide to 160/25 mg.  Return in about 2 weeks for blood pressure recheck.

## 2023-11-01 ENCOUNTER — Other Ambulatory Visit: Payer: Self-pay | Admitting: Family Medicine

## 2023-11-02 ENCOUNTER — Other Ambulatory Visit: Payer: Self-pay | Admitting: Family Medicine

## 2023-11-04 ENCOUNTER — Ambulatory Visit (INDEPENDENT_AMBULATORY_CARE_PROVIDER_SITE_OTHER): Payer: BC Managed Care – PPO

## 2023-11-04 VITALS — BP 142/87 | HR 68 | Ht 66.0 in | Wt 215.0 lb

## 2023-11-04 DIAGNOSIS — I10 Essential (primary) hypertension: Secondary | ICD-10-CM

## 2023-11-04 MED ORDER — VALSARTAN-HYDROCHLOROTHIAZIDE 320-25 MG PO TABS: 1.0000 | ORAL_TABLET | Freq: Every day | ORAL | 3 refills | Status: AC

## 2023-11-04 NOTE — Progress Notes (Signed)
 Pt here to have BP rechecked. Last OV BP 149/84.                           Pt BP first time was 142/87 second time 141/88. Still not stable per Christen Butter, pt valsartan-hydrochlorothiazide to 320-25mg . And pt advised to RTC in 2wks for nurse visit.

## 2023-11-04 NOTE — Addendum Note (Signed)
 Addended byChristen Butter on: 11/04/2023 12:32 PM   Modules accepted: Orders

## 2023-11-08 ENCOUNTER — Ambulatory Visit: Payer: BC Managed Care – PPO | Admitting: Family Medicine

## 2023-11-18 ENCOUNTER — Encounter: Payer: Self-pay | Admitting: Family Medicine

## 2023-11-18 ENCOUNTER — Ambulatory Visit (INDEPENDENT_AMBULATORY_CARE_PROVIDER_SITE_OTHER): Admitting: Family Medicine

## 2023-11-18 VITALS — BP 135/84 | HR 70 | Ht 66.0 in

## 2023-11-18 DIAGNOSIS — I10 Essential (primary) hypertension: Secondary | ICD-10-CM

## 2023-11-18 NOTE — Progress Notes (Signed)
 Pt here for BP check. Last OV 149/84. Denies headaches, fever, chills, or medication changes.                        Pt BP taken slightly elevated at 149/85. Pt sat for 10 mins and then I retook it went down to 135/84. Dr. Ashley Royalty is satisfied with the 2nd reading and made no changes to medications. Pt is to f/u within 4-6 months for routine check.

## 2023-12-28 ENCOUNTER — Other Ambulatory Visit: Payer: Self-pay

## 2023-12-28 DIAGNOSIS — I10 Essential (primary) hypertension: Secondary | ICD-10-CM

## 2023-12-28 MED ORDER — CARVEDILOL 25 MG PO TABS: 25.0000 mg | ORAL_TABLET | Freq: Two times a day (BID) | ORAL | 1 refills | Status: DC

## 2024-02-22 ENCOUNTER — Other Ambulatory Visit: Payer: Self-pay | Admitting: Family Medicine

## 2024-03-25 ENCOUNTER — Other Ambulatory Visit: Payer: Self-pay | Admitting: Family Medicine

## 2024-04-30 ENCOUNTER — Other Ambulatory Visit: Payer: Self-pay | Admitting: Family Medicine

## 2024-04-30 DIAGNOSIS — R7303 Prediabetes: Secondary | ICD-10-CM

## 2024-05-01 NOTE — Telephone Encounter (Signed)
 Pls contact pt to schedule 60-month follow-up with Dr. Alvia. Sending 1 month refill. Thx.

## 2024-05-04 ENCOUNTER — Ambulatory Visit (INDEPENDENT_AMBULATORY_CARE_PROVIDER_SITE_OTHER): Admitting: Family Medicine

## 2024-05-04 VITALS — BP 147/76 | HR 67 | Temp 98.1°F | Resp 20 | Ht 69.0 in | Wt 210.0 lb

## 2024-05-04 DIAGNOSIS — I10 Essential (primary) hypertension: Secondary | ICD-10-CM | POA: Diagnosis not present

## 2024-05-04 DIAGNOSIS — Z23 Encounter for immunization: Secondary | ICD-10-CM

## 2024-05-04 DIAGNOSIS — K7460 Unspecified cirrhosis of liver: Secondary | ICD-10-CM

## 2024-05-04 DIAGNOSIS — Z125 Encounter for screening for malignant neoplasm of prostate: Secondary | ICD-10-CM | POA: Diagnosis not present

## 2024-05-04 DIAGNOSIS — R7303 Prediabetes: Secondary | ICD-10-CM | POA: Diagnosis not present

## 2024-05-04 DIAGNOSIS — Z13220 Encounter for screening for lipoid disorders: Secondary | ICD-10-CM

## 2024-05-04 DIAGNOSIS — Z0000 Encounter for general adult medical examination without abnormal findings: Secondary | ICD-10-CM | POA: Diagnosis not present

## 2024-05-04 MED ORDER — COVID-19 MRNA VAC-TRIS(PFIZER) 30 MCG/0.3ML IM SUSY: 0.3000 mL | PREFILLED_SYRINGE | Freq: Once | INTRAMUSCULAR | 0 refills | Status: AC

## 2024-05-04 NOTE — Progress Notes (Signed)
 Gregory Duarte - 58 y.o. male MRN [id number]  Date of birth: [birth year]  Subjective Chief Complaint  Patient presents with   Annual Exam    HPI Gregory Duarte is a 58 y.o. is a 58 y.o. male here today for annual exam.   He reports that he is doing pretty good.   He is pretty active.  He feels like he is doing pretty well with diet.  He remains on Ozempic  and is tolerating this pretty well.   He is a non-smoker. No EtOH at this time.   Review of Systems  Constitutional:  Negative for chills, fever, malaise/fatigue and weight loss.  HENT:  Negative for congestion, ear pain and sore throat.   Eyes:  Negative for blurred vision, double vision and pain.  Respiratory:  Negative for cough and shortness of breath.   Cardiovascular:  Negative for chest pain and palpitations.  Gastrointestinal:  Negative for abdominal pain, blood in stool, constipation, heartburn and nausea.  Genitourinary:  Negative for dysuria and urgency.  Musculoskeletal:  Negative for joint pain and myalgias.  Neurological:  Negative for dizziness and headaches.  Endo/Heme/Allergies:  Does not bruise/bleed easily.  Psychiatric/Behavioral:  Negative for depression. The patient is not nervous/anxious and does not have insomnia.     No Known Allergies  Past Medical History:  Diagnosis Date   Autoimmune hepatitis (HCC)    Chronic liver disease    Diverticulosis    GERD (gastroesophageal reflux disease)    Hypertension    Kidney stone    Sleep apnea     Past Surgical History:  Procedure Laterality Date   COLONOSCOPY  2017   [place], [place] . Dr Emi   ESOPHAGOGASTRODUODENOSCOPY  2019   [place]  Dr Emi   LITHOTRIPSY     over 10-15 years ago    Social History   Socioeconomic History   Marital status: Married    Spouse name: Taevon Aschoff   Number of children: Not on file   Years of education: Not on file   Highest education level: Bachelor's degree (e.g., BA, AB, BS)  Occupational History    Not on file  Tobacco Use   Smoking status: Former    Current packs/day: 0.00    Types: Cigarettes    Quit date: 1991    Years since quitting: 34.7   Smokeless tobacco: Never  Vaping Use   Vaping status: Some Days   Substances: CBD  Substance and Sexual Activity   Alcohol use: Not Currently   Drug use: Yes    Types: Marijuana    Comment: sometimes   Sexual activity: Yes    Partners: Female  Other Topics Concern   Not on file  Social History Narrative   Not on file   Social Drivers of Health   Financial Resource Strain: Low Risk  (05/04/2024)   Overall Financial Resource Strain (CARDIA)    Difficulty of Paying Living Expenses: Not hard at all  Food Insecurity: No Food Insecurity (05/04/2024)   Hunger Vital Sign    Worried About Running Out of Food in the Last Year: Never true    Ran Out of Food in the Last Year: Never true  Transportation Needs: No Transportation Needs (05/04/2024)   PRAPARE - Administrator, Civil Service (Medical): No    Lack of Transportation (Non-Medical): No  Physical Activity: Insufficiently Active (05/04/2024)   Exercise Vital Sign    Days of Exercise per Week: 2 days    Minutes of Exercise per  Session: 20 min  Stress: No Stress Concern Present (05/04/2024)   [employer] of Occupational Health - Occupational Stress Questionnaire    Feeling of Stress: Not at all  Social Connections: Moderately Isolated (05/04/2024)   Social Connection and Isolation Panel    Frequency of Communication with Friends and Family: More than three times a week    Frequency of Social Gatherings with Friends and Family: More than three times a week    Attends Religious Services: Never    Database administrator or Organizations: No    Attends Engineer, structural: Not on file    Marital Status: Married    Family History  Problem Relation Age of Onset   Diabetes Mother    Autoimmune disease Mother        Autoimmune hepatisits   Hypertension  Father    Diabetes Father    Autoimmune disease Father        Autoimmune hepatitis   Colon cancer Neg Hx    Esophageal cancer Neg Hx     Health Maintenance  Topic Date Due   Pneumococcal Vaccine: 50+ Years (1 of 2 - PCV) Never done   Hepatitis B Vaccines 19-59 Average Risk (1 of 3 - 19+ 3-dose series) Never done   Influenza Vaccine  03/24/2024   COVID-19 Vaccine (7 - Pfizer risk 2024-25 season) 04/24/2024   Colonoscopy  11/12/2025   DTaP/Tdap/Td (2 - Td or Tdap) 05/09/2030   Hepatitis C Screening  Completed   HIV Screening  Completed   Zoster Vaccines- Shingrix  Completed   HPV VACCINES  Aged Out   Meningococcal B Vaccine  Aged Out     ----------------------------------------------------------------------------------------------------------------------------------------------------------------------------------------------------------------- Physical Exam BP (!) 147/76 (BP Location: Left Arm, Patient Position: Sitting, Cuff Size: Normal)   Pulse 67   Temp 98.1 F (36.7 C) (Oral)   Resp 20   Ht 5' 9 (1.753 m)   Wt 210 lb (95.3 kg)   SpO2 99%   BMI 31.01 kg/m   Physical Exam Constitutional:      General: He is not in acute distress. HENT:     Head: Normocephalic and atraumatic.     Right Ear: Tympanic membrane and external ear normal.     Left Ear: Tympanic membrane and external ear normal.  Eyes:     General: No scleral icterus. Neck:     Thyroid: No thyromegaly.  Cardiovascular:     Rate and Rhythm: Normal rate and regular rhythm.     Heart sounds: Normal heart sounds.  Pulmonary:     Effort: Pulmonary effort is normal.     Breath sounds: Normal breath sounds.  Abdominal:     General: Bowel sounds are normal. There is no distension.     Palpations: Abdomen is soft.     Tenderness: There is no abdominal tenderness. There is no guarding.  Musculoskeletal:     Cervical back: Normal range of motion.  Lymphadenopathy:     Cervical: No cervical adenopathy.   Skin:    General: Skin is warm and dry.     Findings: No rash.  Neurological:     Mental Status: He is alert and oriented to person, place, and time.     Cranial Nerves: No cranial nerve deficit.     Motor: No abnormal muscle tone.  Psychiatric:        Mood and Affect: Mood normal.        Behavior: Behavior normal.     ------------------------------------------------------------------------------------------------------------------------------------------------------------------------------------------------------------------- Assessment and  Plan  Well adult exam Well adult Orders Placed This Encounter  Procedures   Flu vaccine trivalent PF, 6mos and older(Flulaval,Afluria,Fluarix,Fluzone)   CMP14+EGFR   CBC with Differential/Platelet   Lipid Panel With LDL/HDL Ratio   PSA   HgB J8r  Screenings; per lab orders Immunizations: flu vaccine Anticipatory guidance/Risk factor reduction:  Recommendations per AVS.    Meds ordered this encounter  Medications   COVID-19 mRNA vaccine, Pfizer, (COMIRNATY) syringe    Sig: Inject 0.3 mLs into the muscle once for 1 dose.    Dispense:  0.3 mL    Refill:  0    Approved at provider discretion. Product selection permitted.    No follow-ups on file.

## 2024-05-04 NOTE — Assessment & Plan Note (Signed)
 Well adult Orders Placed This Encounter  Procedures   Flu vaccine trivalent PF, 6mos and older(Flulaval,Afluria,Fluarix,Fluzone)   CMP14+EGFR   CBC with Differential/Platelet   Lipid Panel With LDL/HDL Ratio   PSA   HgB J8r  Screenings; per lab orders Immunizations: flu vaccine Anticipatory guidance/Risk factor reduction:  Recommendations per AVS.

## 2024-05-04 NOTE — Patient Instructions (Signed)

## 2024-05-05 LAB — CMP14+EGFR
ALT: 25 IU/L (ref 0–44)
AST: 36 IU/L (ref 0–40)
Albumin: 4.4 g/dL (ref 3.8–4.9)
Alkaline Phosphatase: 86 IU/L (ref 44–121)
BUN/Creatinine Ratio: 13 (ref 9–20)
BUN: 15 mg/dL (ref 6–24)
Bilirubin Total: 0.5 mg/dL (ref 0.0–1.2)
CO2: 23 mmol/L (ref 20–29)
Calcium: 9.7 mg/dL (ref 8.7–10.2)
Chloride: 98 mmol/L (ref 96–106)
Creatinine, Ser: 1.16 mg/dL (ref 0.76–1.27)
Globulin, Total: 3.1 g/dL (ref 1.5–4.5)
Glucose: 85 mg/dL (ref 70–99)
Potassium: 3.8 mmol/L (ref 3.5–5.2)
Sodium: 138 mmol/L (ref 134–144)
Total Protein: 7.5 g/dL (ref 6.0–8.5)
eGFR: 73 mL/min/1.73 (ref >59)

## 2024-05-05 LAB — CBC WITH DIFFERENTIAL/PLATELET
Basophils Absolute: 0.1 x10E3/uL (ref 0.0–0.2)
Basos: 1 %
EOS (ABSOLUTE): 0.3 x10E3/uL (ref 0.0–0.4)
Eos: 4 %
Hematocrit: 46.2 % (ref 37.5–51.0)
Hemoglobin: 15.8 g/dL (ref 13.0–17.7)
Immature Grans (Abs): 0 x10E3/uL (ref 0.0–0.1)
Immature Granulocytes: 0 %
Lymphocytes Absolute: 2.5 x10E3/uL (ref 0.7–3.1)
Lymphs: 32 %
MCH: 34.6 pg — ABNORMAL HIGH (ref 26.6–33.0)
MCHC: 34.2 g/dL (ref 31.5–35.7)
MCV: 101 fL — ABNORMAL HIGH (ref 79–97)
Monocytes Absolute: 0.9 x10E3/uL (ref 0.1–0.9)
Monocytes: 12 %
Neutrophils Absolute: 4 x10E3/uL (ref 1.4–7.0)
Neutrophils: 51 %
Platelets: 196 x10E3/uL (ref 150–450)
RBC: 4.56 x10E6/uL (ref 4.14–5.80)
RDW: 13.8 % (ref 11.6–15.4)
WBC: 7.8 x10E3/uL (ref 3.4–10.8)

## 2024-05-05 LAB — LIPID PANEL WITH LDL/HDL RATIO
Cholesterol, Total: 218 mg/dL — ABNORMAL HIGH (ref 100–199)
HDL: 40 mg/dL (ref >39)
LDL Chol Calc (NIH): 133 mg/dL — ABNORMAL HIGH (ref 0–99)
LDL/HDL Ratio: 3.3 ratio (ref 0.0–3.6)
Triglycerides: 253 mg/dL — ABNORMAL HIGH (ref 0–149)
VLDL Cholesterol Cal: 45 mg/dL — ABNORMAL HIGH (ref 5–40)

## 2024-05-05 LAB — HEMOGLOBIN A1C
Est. average glucose Bld gHb Est-mCnc: 114 mg/dL
Hgb A1c MFr Bld: 5.6 % (ref 4.8–5.6)

## 2024-05-05 LAB — PSA: Prostate Specific Ag, Serum: 0.5 ng/mL (ref 0.0–4.0)

## 2024-05-09 ENCOUNTER — Encounter: Payer: Self-pay | Admitting: Family Medicine

## 2024-05-09 DIAGNOSIS — R7303 Prediabetes: Secondary | ICD-10-CM

## 2024-05-10 MED ORDER — OZEMPIC (1 MG/DOSE) 4 MG/3ML ~~LOC~~ SOPN: PEN_INJECTOR | SUBCUTANEOUS | 0 refills | Status: DC

## 2024-05-10 NOTE — Telephone Encounter (Signed)
 [facility] did not have the prescription of Ozempic  showing sent on 05/01/2024 so there was  nothing for me to cancel there.  [facility] to [facility].  Patient was sent a Mychart message regarding this.

## 2024-05-19 ENCOUNTER — Ambulatory Visit: Payer: Self-pay | Admitting: Family Medicine

## 2024-05-22 MED ORDER — OZEMPIC (1 MG/DOSE) 4 MG/3ML ~~LOC~~ SOPN: PEN_INJECTOR | SUBCUTANEOUS | 1 refills | Status: AC

## 2024-05-22 NOTE — Addendum Note (Signed)
 Addended by: Madissen Wyse E on: 05/22/2024 01:23 PM   Modules accepted: Orders

## 2024-06-29 ENCOUNTER — Other Ambulatory Visit: Payer: Self-pay | Admitting: Family Medicine

## 2024-06-29 DIAGNOSIS — I10 Essential (primary) hypertension: Secondary | ICD-10-CM

## 2024-07-02 ENCOUNTER — Telehealth: Admitting: Physician Assistant

## 2024-07-02 DIAGNOSIS — H109 Unspecified conjunctivitis: Secondary | ICD-10-CM

## 2024-07-02 MED ORDER — POLYMYXIN B-TRIMETHOPRIM 10000-0.1 UNIT/ML-% OP SOLN: 2.0000 [drp] | Freq: Four times a day (QID) | OPHTHALMIC | 0 refills | Status: AC

## 2024-07-02 NOTE — Progress Notes (Signed)
 Virtual Visit Consent   Gregory Duarte, you are scheduled for a virtual visit with a [facility] provider today. Just as with appointments in the office, your consent must be obtained to participate. Your consent will be active for this visit and any virtual visit you may have with one of our providers in the next 365 days. If you have a MyChart account, a copy of this consent can be sent to you electronically.  As this is a virtual visit, video technology does not allow for your provider to perform a traditional examination. This may limit your provider's ability to fully assess your condition. If your provider identifies any concerns that need to be evaluated in person or the need to arrange testing (such as labs, EKG, etc.), we will make arrangements to do so. Although advances in technology are sophisticated, we cannot ensure that it will always work on either your end or our end. If the connection with a video visit is poor, the visit may have to be switched to a telephone visit. With either a video or telephone visit, we are not always able to ensure that we have a secure connection.  By engaging in this virtual visit, you consent to the provision of healthcare and authorize for your insurance to be billed (if applicable) for the services provided during this visit. Depending on your insurance coverage, you may receive a charge related to this service.  I need to obtain your verbal consent now. Are you willing to proceed with your visit today? Gregory Duarte has provided verbal consent on 07/02/2024 for a virtual visit (video or telephone). Gregory PEDLAR Ward, PA-C  Date: 07/02/2024 12:01 PM   Virtual Visit via Video Note   I, Gregory Duarte, connected with  Gregory Duarte  ([id number], 16-Oct-1965) on 07/02/24 at 11:15 AM EST by a video-enabled telemedicine application and verified that I am speaking with the correct person using two identifiers.  Location: Patient: Virtual Visit Location Patient:  Home Provider: Virtual Visit Location Provider: Home Office   I discussed the limitations of evaluation and management by telemedicine and the availability of in person appointments. The patient expressed understanding and agreed to proceed.    History of Present Illness: Gregory Duarte is a 58 y.o. who identifies as a male who was assigned male at birth, and is being seen today for right eye redness, drainage, crusting to the eyelashes upon waking that started several days ago.  Pt does not wear contacts.  Denies visual changes.  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  Apply up to 4 days as needed., Disp: 30 g, Rfl: 1   azaTHIOprine  (IMURAN ) 50 MG tablet, TAKE 1 TABLET BY MOUTH EVERY DAY, Disp: 90  tablet, Rfl: 1   carvedilol  (COREG ) 25 MG tablet, TAKE 1 TABLET TWICE A DAY WITH MEALS, Disp: 180 tablet, Rfl: 1   ciclopirox  (LOPROX ) 0.77 % cream, APPLY TO AFFECTED AREA TWICE A DAY, Disp: 30 g, Rfl: 0   clotrimazole -betamethasone  (LOTRISONE ) cream, APPLY 1 APPLICATION TOPICALLY DAILY, Disp: 30 g, Rfl: 0   OMEPRAZOLE PO, Take 1 tablet by mouth daily., Disp: , Rfl:    Semaglutide , 1 MG/DOSE, (OZEMPIC , 1 MG/DOSE,) 4 MG/3ML SOPN,  INJECT 1 MG ONCE A WEEK AS DIRECTED, Disp: 9 mL, Rfl: 1   sildenafil  (VIAGRA ) 100 MG tablet, TAKE 1/2 TO 1 TABLET BY MOUTH DAILY AS NEEDED FOR ERECTILE DYSFUNCTION, Disp: 15 tablet, Rfl: 3   valsartan -hydrochlorothiazide  (DIOVAN -HCT) 320-25 MG tablet, Take 1 tablet by mouth daily., Disp: 90 tablet, Rfl: 3  Observations/Objective: Patient is well-developed, well-nourished in no acute distress.  Resting comfortably  at home.  Head is normocephalic, atraumatic.  No labored breathing.  Speech is clear and coherent with logical content.  Patient is alert and oriented at baseline.    Assessment and Plan: 1. Bacterial conjunctivitis (Primary)  Eye drops prescribed.  Supportive care discussed. Return precautions discussed.   Follow Up Instructions: I discussed the assessment and treatment plan with the patient. The patient was provided an opportunity to ask questions and all were answered. The patient agreed with the plan and demonstrated an understanding of the instructions.  A copy of instructions were sent to the patient via MyChart unless otherwise noted below.     The patient was advised to call back or seek an in-person evaluation if the symptoms worsen or if the condition fails to improve as anticipated.    Gregory PEDLAR Ward, PA-C

## 2024-07-02 NOTE — Patient Instructions (Signed)
  Devan Vania, thank you for joining [name], PA-C for today's virtual visit.  While this provider is not your primary care provider (PCP), if your PCP is located in our provider database this encounter information will be shared with them immediately following your visit.   A [facility] MyChart account gives you access to today's visit and all your visits, tests, and labs performed at [facility]  click here if you don't have a [facility] MyChart account or go to [email or web address]  Consent: (Patient) Gregory Duarte provided verbal consent for this virtual visit at the beginning of the encounter.  Current Medications:  Current Outpatient Medications:    trimethoprim -polymyxin b (POLYTRIM) ophthalmic solution, Place 2 drops into the right eye every 6 (six) hours for 5 days., Disp: 10 mL, Rfl: 0   acyclovir  ointment (ZOVIRAX ) 5 %, Apply every 3 hours while awake.  Apply up to 4 days as needed., Disp: 30 g, Rfl: 1   azaTHIOprine  (IMURAN ) 50 MG tablet, TAKE 1 TABLET BY MOUTH EVERY DAY, Disp: 90 tablet, Rfl: 1   carvedilol  (COREG ) 25 MG tablet, TAKE 1 TABLET TWICE A DAY WITH MEALS, Disp: 180 tablet, Rfl: 1   ciclopirox  (LOPROX ) 0.77 % cream, APPLY TO AFFECTED AREA TWICE A DAY, Disp: 30 g, Rfl: 0   clotrimazole -betamethasone  (LOTRISONE ) cream, APPLY 1 APPLICATION TOPICALLY DAILY, Disp: 30 g, Rfl: 0   OMEPRAZOLE PO, Take 1 tablet by mouth daily., Disp: , Rfl:    Semaglutide , 1 MG/DOSE, (OZEMPIC , 1 MG/DOSE,) 4 MG/3ML SOPN, INJECT 1 MG ONCE A WEEK AS DIRECTED, Disp: 9 mL, Rfl: 1   sildenafil  (VIAGRA ) 100 MG tablet, TAKE 1/2 TO 1 TABLET BY MOUTH DAILY AS NEEDED FOR ERECTILE DYSFUNCTION, Disp: 15 tablet, Rfl: 3   valsartan -hydrochlorothiazide  (DIOVAN -HCT) 320-25 MG tablet, Take 1 tablet by mouth daily., Disp: 90 tablet, Rfl: 3   Medications ordered in this encounter:  Meds ordered this encounter  Medications   trimethoprim -polymyxin b (POLYTRIM) ophthalmic solution    Sig:  Place 2 drops into the right eye every 6 (six) hours for 5 days.    Dispense:  10 mL    Refill:  0    Supervising Provider:   BLAISE ALEENE KIDD [[id number]]     *If you need refills on other medications prior to your next appointment, please contact your pharmacy*  Follow-Up: Call back or seek an in-person evaluation if the symptoms worsen or if the condition fails to improve as anticipated.  [facility] [phone]  Other Instructions Use eye drops 4 times per day for 5 days.  Can apply cold compress for comfort.  If no improvement or symptoms become worse follow up for in person evaluation.    If you have been instructed to have an in-person evaluation today at a local Urgent Care facility, please use the link below. It will take you to a list of all of our available [facility], including address, phone number and hours of operation. Please do not delay care.  [facility]  If you or a family member do not have a primary care provider, use the link below to schedule a visit and establish care. When you choose a [facility] primary care physician or advanced practice provider, you gain a long-term partner in health. Find a Primary Care Provider  Learn more about [facility]'s in-office and virtual care options: [facility] - Get Care Now

## 2024-09-07 ENCOUNTER — Other Ambulatory Visit: Payer: Self-pay

## 2024-09-07 MED ORDER — CICLOPIROX OLAMINE 0.77 % EX CREA: TOPICAL_CREAM | Freq: Two times a day (BID) | CUTANEOUS | 0 refills | Status: AC

## 2024-09-07 MED ORDER — SILDENAFIL CITRATE 100 MG PO TABS: ORAL_TABLET | ORAL | 3 refills | Status: AC

## 2024-09-23 ENCOUNTER — Other Ambulatory Visit: Payer: Self-pay | Admitting: Family Medicine

## 2024-09-27 ENCOUNTER — Other Ambulatory Visit: Payer: Self-pay | Admitting: Family Medicine
